# Patient Record
Sex: Male | Born: 1950
Health system: Southern US, Community
[De-identification: ages and names within clinical notes are randomized; demographics above are authoritative.]

## PROBLEM LIST (undated history)

## (undated) DIAGNOSIS — I2510 Atherosclerotic heart disease of native coronary artery without angina pectoris: Secondary | ICD-10-CM

## (undated) DIAGNOSIS — E119 Type 2 diabetes mellitus without complications: Secondary | ICD-10-CM

## (undated) DIAGNOSIS — I10 Essential (primary) hypertension: Secondary | ICD-10-CM

## (undated) DIAGNOSIS — R251 Tremor, unspecified: Secondary | ICD-10-CM

## (undated) DIAGNOSIS — N289 Disorder of kidney and ureter, unspecified: Secondary | ICD-10-CM

## (undated) HISTORY — DX: Tremor, unspecified: R25.1

---

## 2005-09-02 ENCOUNTER — Ambulatory Visit (HOSPITAL_COMMUNITY): Admission: RE | Admit: 2005-09-02 | Discharge: 2005-09-02 | Payer: Self-pay | Admitting: Specialist

## 2005-09-30 ENCOUNTER — Encounter: Admission: RE | Admit: 2005-09-30 | Discharge: 2005-10-27 | Payer: Self-pay | Admitting: Specialist

## 2011-09-17 ENCOUNTER — Other Ambulatory Visit: Payer: Self-pay | Admitting: Neurosurgery

## 2011-09-17 DIAGNOSIS — M5126 Other intervertebral disc displacement, lumbar region: Secondary | ICD-10-CM

## 2011-10-10 ENCOUNTER — Other Ambulatory Visit: Payer: Self-pay

## 2011-10-31 ENCOUNTER — Inpatient Hospital Stay: Admission: RE | Admit: 2011-10-31 | Payer: Self-pay | Source: Ambulatory Visit

## 2012-04-26 ENCOUNTER — Other Ambulatory Visit: Payer: Self-pay | Admitting: Neurosurgery

## 2012-04-26 DIAGNOSIS — M5126 Other intervertebral disc displacement, lumbar region: Secondary | ICD-10-CM

## 2012-04-27 ENCOUNTER — Ambulatory Visit
Admission: RE | Admit: 2012-04-27 | Discharge: 2012-04-27 | Disposition: A | Discharge status: Home / Self Care | Payer: No Typology Code available for payment source | Source: Ambulatory Visit | Attending: Neurosurgery | Admitting: Neurosurgery

## 2012-04-27 DIAGNOSIS — M5126 Other intervertebral disc displacement, lumbar region: Secondary | ICD-10-CM

## 2012-04-27 MED ORDER — METHYLPREDNISOLONE ACETATE 40 MG/ML INJ SUSP (RADIOLOG
120.0000 mg | Freq: Once | INTRAMUSCULAR | Status: AC
  Administered 2012-04-27: 120 mg via EPIDURAL

## 2012-04-27 MED ORDER — IOHEXOL 180 MG/ML  SOLN
1.0000 mL | Freq: Once | INTRAMUSCULAR | Status: AC | PRN
  Administered 2012-04-27: 1 mL via EPIDURAL

## 2012-04-27 NOTE — Discharge Instructions (Signed)

## 2015-05-08 ENCOUNTER — Encounter (HOSPITAL_COMMUNITY)
Admission: EM | Disposition: A | Discharge status: Home / Self Care | Payer: Medicare Other | Source: Home / Self Care | Attending: Cardiovascular Disease

## 2015-05-08 ENCOUNTER — Inpatient Hospital Stay (HOSPITAL_COMMUNITY)
Admission: EM | Admit: 2015-05-08 | Discharge: 2015-05-10 | DRG: 247 | Disposition: A | Discharge status: Home / Self Care | Payer: 59 | Attending: Cardiovascular Disease | Admitting: Cardiovascular Disease

## 2015-05-08 ENCOUNTER — Emergency Department (HOSPITAL_COMMUNITY)
Admission: EM | Admit: 2015-05-08 | Discharge: 2015-05-08 | Disposition: A | Discharge status: Home / Self Care | Payer: 59 | Source: Ambulatory Visit | Attending: Cardiovascular Disease | Admitting: Cardiovascular Disease

## 2015-05-08 ENCOUNTER — Encounter (HOSPITAL_COMMUNITY): Payer: Self-pay

## 2015-05-08 DIAGNOSIS — I2109 ST elevation (STEMI) myocardial infarction involving other coronary artery of anterior wall: Secondary | ICD-10-CM | POA: Diagnosis not present

## 2015-05-08 DIAGNOSIS — Z7982 Long term (current) use of aspirin: Secondary | ICD-10-CM | POA: Diagnosis not present

## 2015-05-08 DIAGNOSIS — Z9981 Dependence on supplemental oxygen: Secondary | ICD-10-CM | POA: Diagnosis not present

## 2015-05-08 DIAGNOSIS — I10 Essential (primary) hypertension: Secondary | ICD-10-CM

## 2015-05-08 DIAGNOSIS — I213 ST elevation (STEMI) myocardial infarction of unspecified site: Secondary | ICD-10-CM

## 2015-05-08 DIAGNOSIS — I255 Ischemic cardiomyopathy: Secondary | ICD-10-CM | POA: Diagnosis present

## 2015-05-08 DIAGNOSIS — I237 Postinfarction angina: Secondary | ICD-10-CM | POA: Diagnosis present

## 2015-05-08 DIAGNOSIS — I2102 ST elevation (STEMI) myocardial infarction involving left anterior descending coronary artery: Secondary | ICD-10-CM | POA: Diagnosis not present

## 2015-05-08 DIAGNOSIS — I2582 Chronic total occlusion of coronary artery: Secondary | ICD-10-CM | POA: Diagnosis present

## 2015-05-08 DIAGNOSIS — Z7902 Long term (current) use of antithrombotics/antiplatelets: Secondary | ICD-10-CM | POA: Diagnosis not present

## 2015-05-08 DIAGNOSIS — I2510 Atherosclerotic heart disease of native coronary artery without angina pectoris: Secondary | ICD-10-CM | POA: Diagnosis not present

## 2015-05-08 DIAGNOSIS — E785 Hyperlipidemia, unspecified: Secondary | ICD-10-CM

## 2015-05-08 DIAGNOSIS — Z79899 Other long term (current) drug therapy: Secondary | ICD-10-CM

## 2015-05-08 DIAGNOSIS — E1159 Type 2 diabetes mellitus with other circulatory complications: Secondary | ICD-10-CM | POA: Diagnosis not present

## 2015-05-08 DIAGNOSIS — Z743 Need for continuous supervision: Secondary | ICD-10-CM | POA: Diagnosis not present

## 2015-05-08 DIAGNOSIS — E119 Type 2 diabetes mellitus without complications: Secondary | ICD-10-CM | POA: Diagnosis present

## 2015-05-08 HISTORY — DX: Type 2 diabetes mellitus without complications: E11.9

## 2015-05-08 HISTORY — DX: Atherosclerotic heart disease of native coronary artery without angina pectoris: I25.10

## 2015-05-08 HISTORY — PX: CARDIAC CATHETERIZATION: SHX172

## 2015-05-08 HISTORY — DX: Essential (primary) hypertension: I10

## 2015-05-08 HISTORY — PX: PERCUTANEOUS CORONARY STENT INTERVENTION (PCI-S): SHX5485

## 2015-05-08 LAB — COMPREHENSIVE METABOLIC PANEL
ALK PHOS: 79 U/L (ref 38–126)
ALT: 42 U/L (ref 17–63)
AST: 165 U/L — ABNORMAL HIGH (ref 15–41)
Albumin: 4.4 g/dL (ref 3.5–5.0)
Anion gap: 9 (ref 5–15)
BUN: 16 mg/dL (ref 6–20)
CO2: 28 mmol/L (ref 22–32)
Calcium: 9 mg/dL (ref 8.9–10.3)
Chloride: 100 mmol/L — ABNORMAL LOW (ref 101–111)
Creatinine, Ser: 1.1 mg/dL (ref 0.61–1.24)
GLUCOSE: 183 mg/dL — AB (ref 70–99)
POTASSIUM: 4.3 mmol/L (ref 3.5–5.1)
SODIUM: 137 mmol/L (ref 135–145)
Total Bilirubin: 0.9 mg/dL (ref 0.3–1.2)
Total Protein: 8 g/dL (ref 6.5–8.1)

## 2015-05-08 LAB — I-STAT TROPONIN, ED: TROPONIN I, POC: 10.43 ng/mL — AB (ref 0.00–0.08)

## 2015-05-08 LAB — APTT: aPTT: 27 seconds (ref 24–37)

## 2015-05-08 LAB — I-STAT CHEM 8, ED
BUN: 16 mg/dL (ref 6–20)
CHLORIDE: 99 mmol/L — AB (ref 101–111)
Calcium, Ion: 1.13 mmol/L (ref 1.13–1.30)
Creatinine, Ser: 1.1 mg/dL (ref 0.61–1.24)
Glucose, Bld: 186 mg/dL — ABNORMAL HIGH (ref 70–99)
HCT: 49 % (ref 39.0–52.0)
Hemoglobin: 16.7 g/dL (ref 13.0–17.0)
POTASSIUM: 4.4 mmol/L (ref 3.5–5.1)
SODIUM: 138 mmol/L (ref 135–145)
TCO2: 23 mmol/L (ref 0–100)

## 2015-05-08 LAB — CBC
HEMATOCRIT: 44.8 % (ref 39.0–52.0)
HEMOGLOBIN: 15.2 g/dL (ref 13.0–17.0)
MCH: 30.8 pg (ref 26.0–34.0)
MCHC: 33.9 g/dL (ref 30.0–36.0)
MCV: 90.9 fL (ref 78.0–100.0)
Platelets: 302 10*3/uL (ref 150–400)
RBC: 4.93 MIL/uL (ref 4.22–5.81)
RDW: 12.3 % (ref 11.5–15.5)
WBC: 15.4 10*3/uL — ABNORMAL HIGH (ref 4.0–10.5)

## 2015-05-08 LAB — PROTIME-INR
INR: 0.98 (ref 0.00–1.49)
PROTHROMBIN TIME: 13.1 s (ref 11.6–15.2)

## 2015-05-08 LAB — MRSA PCR SCREENING: MRSA BY PCR: NEGATIVE

## 2015-05-08 LAB — POCT ACTIVATED CLOTTING TIME: Activated Clotting Time: 626 seconds

## 2015-05-08 LAB — TROPONIN I: Troponin I: >65 ng/mL (ref <0.031)

## 2015-05-08 SURGERY — LEFT HEART CATH AND CORONARY ANGIOGRAPHY

## 2015-05-08 MED ORDER — HEPARIN SODIUM (PORCINE) 1000 UNIT/ML IJ SOLN
INTRAMUSCULAR | Status: DC | PRN
  Administered 2015-05-08: 2000 [IU] via INTRAVENOUS

## 2015-05-08 MED ORDER — VERAPAMIL HCL 2.5 MG/ML IV SOLN
INTRAVENOUS | Status: DC | PRN
  Administered 2015-05-08: 12:00:00 via INTRA_ARTERIAL

## 2015-05-08 MED ORDER — HEPARIN (PORCINE) IN NACL 100-0.45 UNIT/ML-% IJ SOLN
INTRAMUSCULAR | Status: AC
  Filled 2015-05-08: qty 250

## 2015-05-08 MED ORDER — SODIUM CHLORIDE 0.9 % IV SOLN
INTRAVENOUS | Status: DC
  Administered 2015-05-08: 11:00:00 via INTRAVENOUS
  Administered 2015-05-08: 250 mL via INTRAVENOUS

## 2015-05-08 MED ORDER — ATORVASTATIN CALCIUM 80 MG PO TABS
80.0000 mg | ORAL_TABLET | Freq: Every day | ORAL | Status: DC
  Administered 2015-05-08 – 2015-05-09 (×2): 80 mg via ORAL
  Filled 2015-05-08 (×3): qty 1

## 2015-05-08 MED ORDER — MIDAZOLAM HCL 2 MG/2ML IJ SOLN
INTRAMUSCULAR | Status: DC | PRN
  Administered 2015-05-08: 2 mg via INTRAVENOUS

## 2015-05-08 MED ORDER — NITROGLYCERIN 1 MG/10 ML FOR IR/CATH LAB
INTRA_ARTERIAL | Status: DC | PRN
  Administered 2015-05-08 (×3): 100 ug via INTRA_ARTERIAL

## 2015-05-08 MED ORDER — ASPIRIN 81 MG PO CHEW
324.0000 mg | CHEWABLE_TABLET | Freq: Once | ORAL | Status: AC
  Administered 2015-05-08: 324 mg via ORAL

## 2015-05-08 MED ORDER — ONDANSETRON HCL 4 MG/2ML IJ SOLN: 4.0000 mg | Freq: Four times a day (QID) | INTRAMUSCULAR | Status: DC | PRN

## 2015-05-08 MED ORDER — HEPARIN BOLUS VIA INFUSION
4000.0000 [IU] | Freq: Once | INTRAVENOUS | Status: AC
  Administered 2015-05-08: 4000 [IU] via INTRAVENOUS

## 2015-05-08 MED ORDER — IOHEXOL 350 MG/ML SOLN
INTRAVENOUS | Status: DC | PRN
  Administered 2015-05-08: 200 mL via INTRACARDIAC

## 2015-05-08 MED ORDER — TICAGRELOR 90 MG PO TABS
ORAL_TABLET | ORAL | Status: DC | PRN
Start: 2015-05-08 — End: 2015-05-08
  Administered 2015-05-08: 180 mg via ORAL

## 2015-05-08 MED ORDER — BIVALIRUDIN BOLUS VIA INFUSION - CUPID
INTRAVENOUS | Status: DC | PRN
  Administered 2015-05-08: 68.025 mg via INTRAVENOUS

## 2015-05-08 MED ORDER — HEPARIN SODIUM (PORCINE) 5000 UNIT/ML IJ SOLN: 60.0000 [IU]/kg | INTRAMUSCULAR | Status: DC

## 2015-05-08 MED ORDER — HEPARIN (PORCINE) IN NACL 100-0.45 UNIT/ML-% IJ SOLN
1100.0000 [IU]/h | INTRAMUSCULAR | Status: DC
  Administered 2015-05-08: 1100 [IU]/h via INTRAVENOUS
  Filled 2015-05-08: qty 250

## 2015-05-08 MED ORDER — ASPIRIN 81 MG PO CHEW
CHEWABLE_TABLET | ORAL | Status: AC
  Filled 2015-05-08: qty 4

## 2015-05-08 MED ORDER — SODIUM CHLORIDE 0.9 % IJ SOLN
3.0000 mL | INTRAMUSCULAR | Status: DC | PRN
Start: 2015-05-08 — End: 2015-05-10

## 2015-05-08 MED ORDER — TICAGRELOR 90 MG PO TABS
90.0000 mg | ORAL_TABLET | Freq: Two times a day (BID) | ORAL | Status: DC
  Administered 2015-05-08 – 2015-05-10 (×4): 90 mg via ORAL
  Filled 2015-05-08 (×5): qty 1

## 2015-05-08 MED ORDER — SODIUM CHLORIDE 0.9 % IV SOLN: 250.0000 mL | INTRAVENOUS | Status: DC | PRN

## 2015-05-08 MED ORDER — SODIUM CHLORIDE 0.9 % IJ SOLN
3.0000 mL | Freq: Two times a day (BID) | INTRAMUSCULAR | Status: DC
  Administered 2015-05-09 – 2015-05-10 (×3): 3 mL via INTRAVENOUS

## 2015-05-08 MED ORDER — ACETAMINOPHEN 325 MG PO TABS: 650.0000 mg | ORAL_TABLET | ORAL | Status: DC | PRN

## 2015-05-08 MED ORDER — ASPIRIN EC 81 MG PO TBEC
81.0000 mg | DELAYED_RELEASE_TABLET | Freq: Every day | ORAL | Status: DC
Start: 2015-05-09 — End: 2015-05-10
  Administered 2015-05-09 – 2015-05-10 (×2): 81 mg via ORAL
  Filled 2015-05-08 (×2): qty 1

## 2015-05-08 MED ORDER — SODIUM CHLORIDE 0.9 % IV SOLN
250.0000 mg | INTRAVENOUS | Status: DC | PRN
  Administered 2015-05-08: 1.75 mg/kg/h via INTRAVENOUS

## 2015-05-08 MED ORDER — SODIUM CHLORIDE 0.9 % IV SOLN
INTRAVENOUS | Status: AC
Start: 2015-05-08 — End: 2015-05-08

## 2015-05-08 MED ORDER — FENTANYL CITRATE (PF) 100 MCG/2ML IJ SOLN
INTRAMUSCULAR | Status: DC | PRN
  Administered 2015-05-08: 50 ug via INTRAVENOUS

## 2015-05-08 SURGICAL SUPPLY — 21 items
BALLN EUPHORA RX 2.5X12 (BALLOONS) ×3
BALLN ~~LOC~~ EUPHORA RX 3.75X15 (BALLOONS) ×3
BALLOON EUPHORA RX 2.5X12 (BALLOONS) ×2 IMPLANT
BALLOON ~~LOC~~ EUPHORA RX 3.75X15 (BALLOONS) ×2 IMPLANT
CATH INFINITI 5FR MULTPACK ANG (CATHETERS) IMPLANT
CATH OPTITORQUE TIG 4.0 5F (CATHETERS) ×3 IMPLANT
CATH VISTA GUIDE 6FR XBLAD3.5 (CATHETERS) ×3 IMPLANT
DEVICE RAD COMP TR BAND LRG (VASCULAR PRODUCTS) ×3 IMPLANT
ELECT DEFIB PAD ADLT CADENCE (PAD) ×3 IMPLANT
GLIDESHEATH SLEND A-KIT 6F 22G (SHEATH) ×3 IMPLANT
KIT ENCORE 26 ADVANTAGE (KITS) ×3 IMPLANT
KIT HEART LEFT (KITS) ×3 IMPLANT
PACK CARDIAC CATHETERIZATION (CUSTOM PROCEDURE TRAY) ×3 IMPLANT
SHEATH PINNACLE 5F 10CM (SHEATH) ×3 IMPLANT
STENT XIENCE ALPINE RX 3.5X23 (Permanent Stent) ×3 IMPLANT
SYR MEDRAD MARK V 150ML (SYRINGE) ×3 IMPLANT
TRANSDUCER W/STOPCOCK (MISCELLANEOUS) ×3 IMPLANT
TUBING CIL FLEX 10 FLL-RA (TUBING) ×3 IMPLANT
WIRE ASAHI MEDIUM 180CM (WIRE) ×3 IMPLANT
WIRE EMERALD 3MM-J .035X150CM (WIRE) IMPLANT
WIRE SAFE-T 1.5MM-J .035X260CM (WIRE) ×3 IMPLANT

## 2015-05-08 NOTE — ED Notes (Signed)
RN explained transfer need, patient signed transfer consent form.

## 2015-05-08 NOTE — ED Notes (Signed)
Called [facility] to transport pt[facility].

## 2015-05-08 NOTE — ED Notes (Signed)
TROPONIN : ISTAT: 10.43  MD MILLER MADE AWARE.

## 2015-05-08 NOTE — ED Notes (Signed)
[facility] at bedside.   Family made aware of pt status.

## 2015-05-08 NOTE — ED Notes (Signed)
C/o mid center cp x3 days. Describes pain as a burning sensation.

## 2015-05-08 NOTE — Progress Notes (Signed)
ANTICOAGULATION CONSULT NOTE - Initial Consult  Pharmacy Consult for Heparin Indication: chest pain/ACS  No Known Allergies  Patient Measurements: Height: 6\' 1"  (185.4 cm) Weight: 200 lb (90.719 kg) IBW/kg (Calculated) : 79.9 HEPARIN DW (KG): 90.7  Vital Signs: Temp: 98.5 F (36.9 C) (05/10 1030) Temp Source: Oral (05/10 1030) BP: 123/92 mmHg (05/10 1030) Pulse Rate: 112 (05/10 1030)  Labs: No results for input(s): HGB, HCT, PLT, APTT, LABPROT, INR, HEPARINUNFRC, CREATININE, CKTOTAL, CKMB, TROPONINI in the last 72 hours.  CrCl cannot be calculated (Patient has no serum creatinine result on file.).  Medical History: Past Medical History  Diagnosis Date  . Diabetes mellitus without complication   . Hypertension    Assessment: 64yo male with c/o chest pain and burning.  ED called and asked pharmacy to order Heparin per protocol as STAT order.    Goal of Therapy:  Heparin level 0.3-0.7 units/ml w/in 24hrs of initiation Monitor platelets by anticoagulation protocol: Yes   Plan:   Heparin 4000 units/hr bolus IV now  Heparin infusion at 1100 units/hr  Heparin level in 6 hrs then daily  CBC daily while on Heparin  Margo AyeHall, Gagan Dillion A 05/08/2015,11:03 AM

## 2015-05-08 NOTE — Plan of Care (Signed)
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

## 2015-05-08 NOTE — ED Provider Notes (Signed)
CSN: [id number]     Arrival date & time 05/08/15  1025 History   First MD Initiated Contact with Patient 05/08/15 1042     Chief Complaint  Patient presents with  . Chest Pain     (Consider location/radiation/quality/duration/timing/severity/associated sxs/prior Treatment) HPI Comments: 64 y/o male, 3 days of discomfort in chest - burning / pressure, no radiation - has some weakness, but no nausea, sob or swelling, no rf for PE - has DM and Htn - went to MD's office this morning and recommended he come here.  Was much worse last night - eased off at this time- took 325 ASA last night - no prior stress or cath, no known heart disease, no cholesterdal / tobacco use - unsure if anything makes better or worse - though was GERD.  Patient is a 64 y.o. male presenting with chest pain. The history is provided by the patient.  Chest Pain   Past Medical History  Diagnosis Date  . Diabetes mellitus without complication   . Hypertension    History reviewed. No pertinent past surgical history. No family history on file. History  Substance Use Topics  . Smoking status: Never Smoker   . Smokeless tobacco: Not on file  . Alcohol Use: No    Review of Systems  Cardiovascular: Positive for chest pain.  All other systems reviewed and are negative.     Allergies  Review of patient's allergies indicates no known allergies.  Home Medications   Prior to Admission medications   Not on File   BP 123/92 mmHg  Pulse 112  Temp(Src) 98.5 F (36.9 C) (Oral)  Resp 16  SpO2 100% Physical Exam  Constitutional: He appears well-developed and well-nourished. No distress.  HENT:  Head: Normocephalic and atraumatic.  Mouth/Throat: Oropharynx is clear and moist. No oropharyngeal exudate.  Eyes: Conjunctivae and EOM are normal. Pupils are equal, round, and reactive to light. Right eye exhibits no discharge. Left eye exhibits no discharge. No scleral icterus.  Neck: Normal range of motion. Neck  supple. No JVD present. No thyromegaly present.  Cardiovascular: Regular rhythm, normal heart sounds and intact distal pulses.  Exam reveals no gallop and no friction rub.   No murmur heard. Mild tachy  Pulmonary/Chest: Effort normal and breath sounds normal. No respiratory distress. He has no wheezes. He has no rales.  Abdominal: Soft. Bowel sounds are normal. He exhibits no distension and no mass. There is no tenderness.  Musculoskeletal: Normal range of motion. He exhibits no edema or tenderness.  Lymphadenopathy:    He has no cervical adenopathy.  Neurological: He is alert. Coordination normal.  Skin: Skin is warm and dry. No rash noted. No erythema.  Psychiatric: He has a normal mood and affect. His behavior is normal.  Nursing note and vitals reviewed.   ED Course  Procedures (including critical care time) Labs Review Labs Reviewed  APTT  CBC  COMPREHENSIVE METABOLIC PANEL  PROTIME-INR  I-STAT TROPOININ, ED  I-STAT CHEM 8, ED    Imaging Review No results found.   EKG Interpretation   Date/Time:  Tuesday May 08 2015 10:41:10 EDT Ventricular Rate:  104 PR Interval:  144 QRS Duration: 90 QT Interval:  361 QTC Calculation: 475 R Axis:   -161 Text Interpretation:  Sinus tachycardia Low voltage, precordial leads  Right ventricular hypertrophy Nonspecific T abnrm, anterolateral leads ST  elevation, consider lateral injury ** ** ACUTE MI / STEMI ** ** No old  tracing to compare Confirmed by [facility]  MD, Arlys JohnBRIAN ([id number]) on 05/08/2015  10:48:40 AM      MDM   Final diagnoses:  ST elevation myocardial infarction (STEMI), unspecified artery    ECG very abnormal - suspect ongoing MI - some elements of STEMI prsent - activated. ASA' Nitro Heparin Stat consult to cardiology.  Accepted by Dr. Tresa EndoKelly.  10:55 AM  CRITICAL CARE Performed by: Vida RollerMILLER,Kasim Mccorkle D Total critical care time: 35  Critical care time was exclusive of separately billable procedures and treating other  patients. Critical care was necessary to treat or prevent imminent or life-threatening deterioration. Critical care was time spent personally by me on the following activities: development of treatment plan with patient and/or surrogate as well as nursing, discussions with consultants, evaluation of patient's response to treatment, examination of patient, obtaining history from patient or surrogate, ordering and performing treatments and interventions, ordering and review of laboratory studies, ordering and review of radiographic studies, pulse oximetry and re-evaluation of patient's condition.  Meds given in ED:  Medications  0.9 %  sodium chloride infusion (not administered)  heparin injection 60 Units/kg (not administered)  aspirin 81 MG chewable tablet (not administered)  aspirin chewable tablet 324 mg (324 mg Oral Given 05/08/15 1053)    New Prescriptions   No medications on file        Eber HongBrian Fatimata Talsma, MD 05/08/15 1055

## 2015-05-08 NOTE — H&P (Signed)
Cardiology Admission Note      Date: 05/08/2015               Patient Name:  Harry Bowen MRN: [id number]  DOB: 13-Sep-1951 Age / Sex: 64 y.o., male   PCP: Harry PandyPaul W Sasser, MD         Chief Complaint: chest pain  History of Present Illness:  [name] is a 64 year old male with PMH of HTN and DM type 2 who reports CP x 4 days duration.  It is constant 3-4/10 CP that came on gradually.  He describes it as "tightness and burning" located in the center of his chest.  The pain does not radiate to his neck, arm or back.  He denies dyspnea, N/V, diaphoresis or lightheadedness.  He attributed it to indigestion.  The pain worsened last night and he decided to see his PCP this morning.  His PCP directed him to [facility] where he was found to have a ST elevation on EKG and troponin 10.43.  He was given ASA, NTG, started on heparin gtt and [facility].  [facility] he is awake, alert, communicative and stable.  CP improving with above intervention.    He reports personal history of HTN and DM well controlled with oral medications.  He denies personal CAD hx, HLD, EtOH or illicit drug use.  He has never been a smoker.  He had a grandfather who had an MI after age 66 and reports no other family hx of heart disease.    Meds: Current Facility-Administered Medications  Medication Dose Route Frequency Provider Last Rate Last Dose  . 0.9 %  sodium chloride infusion   Intravenous Continuous Harry HongBrian Miller, MD 20 mL/hr at 05/08/15 1058    . fentaNYL (SUBLIMAZE) injection    PRN Harry Biharihomas A Linas Stepter, MD   50 mcg at 05/08/15 1211  . heparin ADULT infusion 100 units/mL (25000 units/250 mL)  1,100 Units/hr Intravenous Continuous Harry HongBrian Miller, MD 11 mL/hr at 05/08/15 1116 1,100 Units/hr at 05/08/15 1116  . midazolam (VERSED) injection    PRN Harry Biharihomas A Vira Chaplin, MD   2 mg at 05/08/15 1211    Allergies: Allergies as of 05/08/2015  . (No Known Allergies)   Past Medical History  Diagnosis Date  . Diabetes mellitus  without complication   . Hypertension    History reviewed. No pertinent past surgical history. No family history on file. History   Social History  . Marital Status: Married    Spouse Name: N/A  . Number of Children: N/A  . Years of Education: N/A   Occupational History  . Not on file.   Social History Main Topics  . Smoking status: Never Smoker   . Smokeless tobacco: Not on file  . Alcohol Use: No  . Drug Use: Not on file  . Sexual Activity: Not on file   Other Topics Concern  . Not on file   Social History Narrative  . No narrative on file    Review of Systems: General:  He denies fatigue, anorexia or unexplained weight loss Cardiopulmonary:  See HPI; he denies dyspnea, PND, orthopnea or LE edema GI:  He denies abdominal pain, N/V or dark stools GU:  Denies dysuria or difficulty urinating Neuro:  Denies weakness  Physical Exam: Blood pressure 123/92, pulse 112, temperature 98.5 F (36.9 C), temperature source Oral, resp. rate 16, height 6\' 1"  (1.854 m), weight 200 lb (90.719 kg), SpO2 100 %.  General:  Laying on gurney in NAD, pleasant and cooperative HEENT:  [place]/AT, no JVD or carotid bruits, [place] in place Cardiac:  Tachycardic, regular rhythm, no rubs/murmus/gallops Pulm:  Clear to auscultation B/L, no wheezes/rales/rhonchi Abd:  +BS, soft, nontender, nondistended Extremities:  Warm, well perfused, no LE edema Neuro:  AAO x3, responding appropriately, moving extremities on command   Lab results: Basic Metabolic Panel:  Recent Labs  21/30/8604/10/13 1050  NA 137  K 4.3  CL 100*  CO2 28  GLUCOSE 183*  BUN 16  CREATININE 1.10  CALCIUM 9.0   Liver Function Tests:  Recent Labs  05/08/15 1050  AST 165*  ALT 42  ALKPHOS 79  BILITOT 0.9  PROT 8.0  ALBUMIN 4.4   CBC:  Recent Labs  05/08/15 1050  WBC 15.4*  HGB 15.2  HCT 44.8  MCV 90.9  PLT 302   Cardiac Enzymes: poc Troponin:  10.43   Coagulation:  Recent Labs  05/08/15 1050  LABPROT 13.1   INR 0.98    Other results: EKG:  Sinus tachycardia (HR 104); extreme R axis deviation; ST elevation avL, V1-V3; Q waves I, avL, V1-V3; ST depression III, avF  Assessment & Plan by Problem: 64 year old male with PMH of HTN and DM type 2 with 4 day history of CP found to have STEMI.  ST elevation MI:  CP, troponin 10.43 and ST elevation.  ST elevations, Q waves in V1-V3 and reciprocal changes in inferior leads suggestive of anterior/apical infarction.  TIMI risk score for STEMI is 7 in this patient (23% risk of 30 day all cause mortality).   - to cath lab for urgent PCI with stenting if amenable lesion - admit to CCU on cardiology service - supplemental oxygen prn - monitor closely for MI complications (ie arrhythmia, HF) - repeat EKG, trend troponin - 2D ECHO - continue heparin gtt - continue ASA 81mg  daily; will need DAPT x 12 months if stent placed - start Lipitor 80mg  daily, carvedilol 3.125mg  BID, lisinopril 5mg  daily (titrate to 10mg  in the next 48 hours) - risk stratification - AM fasting lipid panel, hgb A1c - check CBC and renal function in the morning post-cath  Essential hypertension:  The patient could not remember his home medications but he reports compliance.  - request pharmacy reconciliation of home medications - monitor BP  Diabetes mellitus:  The patient says his DM is well controlled on metformin.   - check hgb A1c  - CBGs ac/hs - SSI  VTE ppx:  SCDs and currently on heparin gtt  Signed: Yolanda MangesAlex M Wilson, DO IMTS, PGY2 05/08/2015, 12:16 PM   Patient seen and examined. Agree with assessment and plan. Pt is a 64 yo WM without known prior CAD. He has a h/o HTN and DM. He has experienced recurrent chest pain over the past 3-4 days which became more intense last night. He had initially contributed this to indigestion. Due to recurrent discomfort today he presented to his primary MD who [facility]. He presents in transfer from [facility] with probable out  of hospital anterior MI of greater than 12 hrs with post infarct angina. ECG shows Q waves anteroseptally with ST elevation. Pt is still experiencing 3/10 residual discomfort. Plan emergent cath/PCI if needed.Suspect LAD occlusion.   Harry Biharihomas A. Leverett Camplin, MD, [facility] 05/08/2015 10:14 PM

## 2015-05-09 DIAGNOSIS — I2109 ST elevation (STEMI) myocardial infarction involving other coronary artery of anterior wall: Secondary | ICD-10-CM

## 2015-05-09 DIAGNOSIS — E1159 Type 2 diabetes mellitus with other circulatory complications: Secondary | ICD-10-CM

## 2015-05-09 LAB — CBC
HCT: 39.3 % (ref 39.0–52.0)
Hemoglobin: 13.4 g/dL (ref 13.0–17.0)
MCH: 30.5 pg (ref 26.0–34.0)
MCHC: 34.1 g/dL (ref 30.0–36.0)
MCV: 89.5 fL (ref 78.0–100.0)
PLATELETS: 253 10*3/uL (ref 150–400)
RBC: 4.39 MIL/uL (ref 4.22–5.81)
RDW: 12.4 % (ref 11.5–15.5)
WBC: 12.6 10*3/uL — AB (ref 4.0–10.5)

## 2015-05-09 LAB — LIPID PANEL
CHOL/HDL RATIO: 4 ratio
CHOLESTEROL: 144 mg/dL (ref 0–200)
HDL: 36 mg/dL — ABNORMAL LOW (ref >40)
LDL Cholesterol: 91 mg/dL (ref 0–99)
Triglycerides: 87 mg/dL (ref <150)
VLDL: 17 mg/dL (ref 0–40)

## 2015-05-09 LAB — HEMOGLOBIN A1C
HEMOGLOBIN A1C: 7.3 % — AB (ref 4.8–5.6)
MEAN PLASMA GLUCOSE: 163 mg/dL

## 2015-05-09 LAB — GLUCOSE, CAPILLARY
GLUCOSE-CAPILLARY: 246 mg/dL — AB (ref 70–99)
GLUCOSE-CAPILLARY: 125 mg/dL — AB (ref 70–99)
GLUCOSE-CAPILLARY: 224 mg/dL — AB (ref 70–99)

## 2015-05-09 LAB — BASIC METABOLIC PANEL
ANION GAP: 8 (ref 5–15)
BUN: 12 mg/dL (ref 6–20)
CHLORIDE: 102 mmol/L (ref 101–111)
CO2: 24 mmol/L (ref 22–32)
Calcium: 8.3 mg/dL — ABNORMAL LOW (ref 8.9–10.3)
Creatinine, Ser: 1.12 mg/dL (ref 0.61–1.24)
GFR calc non Af Amer: >60 mL/min (ref >60)
Glucose, Bld: 167 mg/dL — ABNORMAL HIGH (ref 70–99)
POTASSIUM: 4.1 mmol/L (ref 3.5–5.1)
SODIUM: 134 mmol/L — AB (ref 135–145)

## 2015-05-09 MED ORDER — INSULIN ASPART 100 UNIT/ML ~~LOC~~ SOLN
0.0000 [IU] | Freq: Three times a day (TID) | SUBCUTANEOUS | Status: DC
  Administered 2015-05-09: 1 [IU] via SUBCUTANEOUS
  Administered 2015-05-09: 3 [IU] via SUBCUTANEOUS
  Administered 2015-05-10: 2 [IU] via SUBCUTANEOUS

## 2015-05-09 MED ORDER — METOPROLOL TARTRATE 12.5 MG HALF TABLET
12.5000 mg | ORAL_TABLET | Freq: Two times a day (BID) | ORAL | Status: DC
Start: 2015-05-09 — End: 2015-05-10
  Administered 2015-05-09 – 2015-05-10 (×3): 12.5 mg via ORAL
  Filled 2015-05-09 (×4): qty 1

## 2015-05-09 MED FILL — Heparin Sodium (Porcine) 2 Unit/ML in Sodium Chloride 0.9%: INTRAMUSCULAR | Qty: 1500 | Status: AC

## 2015-05-09 MED FILL — Lidocaine HCl Local Preservative Free (PF) Inj 1%: INTRAMUSCULAR | Qty: 30 | Status: AC

## 2015-05-09 NOTE — Progress Notes (Signed)
CARDIAC REHAB PHASE I   PRE:  Rate/Rhythm: 92 SR  BP:  Sitting: 101/67        SaO2: 99 RA  MODE:  Ambulation: 700 ft   POST:  Rate/Rhythm: 106 ST  BP:  Sitting: 117/77         SaO2: 98 RA  Pt ambulated 700 ft on RA, independent, steady gait, tolerated well.  Pt denies cp, DOE, dizziness, declined rest stop. Began stent education. Reviewed anti-platelet therapy, stent card, gave diet handouts and MI book. Pt verbalized understanding. Pt transferring to telemetry today, will follow up tomorrow.    [id number]  Harry Bowen, Harry Passon C, RN, BSN 05/09/2015 10:50 AM

## 2015-05-09 NOTE — Progress Notes (Signed)
Cardiology Progress Note   Subjective: [name] was seen and examined this morning.  He is feeling well and denies CP, dyspnea or lightheadedness.    Objective: Vital signs in last 24 hours: Filed Vitals:   05/09/15 0400 05/09/15 0500 05/09/15 0600 05/09/15 0730  BP: 96/67  81/47 101/62  Pulse: 87 83 85 93  Temp: 99.3 F (37.4 C)   99.5 F (37.5 C)  TempSrc: Oral   Oral  Resp: 22 23 21 24   Height:      Weight:      SpO2: 98% 98% 97% 98%   Weight change:   Intake/Output Summary (Last 24 hours) at 05/09/15 [id number] Last data filed at 05/09/15 0600  Gross per 24 hour  Intake 1709.74 ml  Output   2275 ml  Net -565.26 ml   Physical Exam: General: in bed in NAD, pleasant and cooperative HEENT: [place]/AT, no JVD or carotid bruits Cardiac: RRR, no rubs/murmus/gallops, distal pulses intact Pulm: Clear to auscultation B/L, no wheezes/rales/rhonchi Abd: +BS, soft, nontender, nondistended Extremities: Warm and well perfused, no LE edema, right radial dressing c/d/i Neuro: AAO x3, responding appropriately, moving extremities spontaneously, able to sit up unassisted  Telemetry:  NSR 88  Lab Results: Basic Metabolic Panel:  Recent Labs Lab 05/08/15 1050 05/08/15 1057 05/09/15 0645  NA 137 138 134*  K 4.3 4.4 4.1  CL 100* 99* 102  CO2 28  --  24  GLUCOSE 183* 186* 167*  BUN 16 16 12   CREATININE 1.10 1.10 1.12  CALCIUM 9.0  --  8.3*   Liver Function Tests:  Recent Labs Lab 05/08/15 1050  AST 165*  ALT 42  ALKPHOS 79  BILITOT 0.9  PROT 8.0  ALBUMIN 4.4   CBC:  Recent Labs Lab 05/08/15 1050 05/08/15 1057 05/09/15 0645  WBC 15.4*  --  12.6*  HGB 15.2 16.7 13.4  HCT 44.8 49.0 39.3  MCV 90.9  --  89.5  PLT 302  --  253   Cardiac Enzymes:  Recent Labs Lab 05/08/15 1421  TROPONINI >65.00*   Hemoglobin A1C:  Recent Labs Lab 05/08/15 1519  HGBA1C 7.3*   Fasting Lipid Panel:  Recent Labs Lab 05/09/15 0645  CHOL 144  HDL 36*  LDLCALC 91   TRIG 87  CHOLHDL 4.0   Coagulation:  Recent Labs Lab 05/08/15 1050  LABPROT 13.1  INR 0.98   Medications: I have reviewed the patient's current medications. Scheduled Meds: . aspirin EC  81 mg Oral Daily  . atorvastatin  80 mg Oral q1800  . sodium chloride  3 mL Intravenous Q12H  . ticagrelor  90 mg Oral BID   Continuous Infusions:  PRN Meds:.sodium chloride, acetaminophen, ondansetron (ZOFRAN) IV, sodium chloride   Assessment/Plan: 64 year old male with PMH of HTN and DM type 2 with 4 day history of CP found to have STEMI.  STEMI:  ST elevation and Q waves in V1-V3 c/w anteroseptal MI.  Emergent cath revealed complete occlusion of the LAD and the patient is s/p DES to the LAD.  The patient denies CP currently and says he has not experienced CP since prior to cath.  There are no new murmurs, rubs or gallops on cardiac exam. - transfer to telemetry - continue to monitor for MI complications for at least another 24 hours inpatient - will defer 2D ECHO to outpatient since EF was estimated at 45% during cath LV-gram - continue ASA and Brilinta x 12 months post DES - continue Lipitor  80mg  daily, add back home lisinopril once BP can tolerate - add metoprolol 12.5mg  BID  Essential hypertension:  BPs soft this AM.  Patient asymptomatic.  He is on lisinopril 10mg  daily at home. - monitor BP - add metoprolol as above - add back lisinopril once BP can tolerate  Diabetes mellitus:  Hgb A1c is 7.3.  He is on metformin 1000mg  daily at home. - hold metformin for now since he has just received contrast with PCI - monitor CBGs ac/hs - SSI-sensitive    LOS: 1 day   Yolanda MangesAlex M Wilson, DO 05/09/2015, 7:22 AM  I have examined the patient and reviewed assessment and plan and discussed with patient.  Agree with above as stated.   Doing well post LAD PCI.  Intensify medical therapy with beta blocker.  Possible d/c tomorrow.  F/u with Dr. [name].    Harry Regnier S.

## 2015-05-09 NOTE — Progress Notes (Signed)
CSW (Clinical Child psychotherapistocial Worker) received consult. Consult may be more appropriate for [facility] for [facility]. CSW will sign on if needs arise.    Adon Gehlhausen, LCSWA [phone]

## 2015-05-09 NOTE — Progress Notes (Signed)
EKG CRITICAL VALUE     12 lead EKG performed.  Critical value noted.  [place], RN notified.   Ihor GullyBrown, Lizzete Gough M, CCT 05/09/2015 6:56 AM

## 2015-05-10 ENCOUNTER — Encounter (HOSPITAL_COMMUNITY): Payer: Self-pay | Admitting: Nurse Practitioner

## 2015-05-10 ENCOUNTER — Telehealth: Payer: Self-pay | Admitting: Cardiovascular Disease

## 2015-05-10 DIAGNOSIS — E785 Hyperlipidemia, unspecified: Secondary | ICD-10-CM

## 2015-05-10 DIAGNOSIS — I2510 Atherosclerotic heart disease of native coronary artery without angina pectoris: Secondary | ICD-10-CM | POA: Diagnosis present

## 2015-05-10 LAB — COMPREHENSIVE METABOLIC PANEL
ALT: 25 U/L (ref 17–63)
AST: 50 U/L — AB (ref 15–41)
Albumin: 3.1 g/dL — ABNORMAL LOW (ref 3.5–5.0)
Alkaline Phosphatase: 59 U/L (ref 38–126)
Anion gap: 10 (ref 5–15)
BILIRUBIN TOTAL: 0.9 mg/dL (ref 0.3–1.2)
BUN: 16 mg/dL (ref 6–20)
CHLORIDE: 101 mmol/L (ref 101–111)
CO2: 23 mmol/L (ref 22–32)
Calcium: 8.3 mg/dL — ABNORMAL LOW (ref 8.9–10.3)
Creatinine, Ser: 1.19 mg/dL (ref 0.61–1.24)
GFR calc Af Amer: >60 mL/min (ref >60)
Glucose, Bld: 163 mg/dL — ABNORMAL HIGH (ref 65–99)
POTASSIUM: 4 mmol/L (ref 3.5–5.1)
Sodium: 134 mmol/L — ABNORMAL LOW (ref 135–145)
Total Protein: 6.8 g/dL (ref 6.5–8.1)

## 2015-05-10 LAB — GLUCOSE, CAPILLARY
GLUCOSE-CAPILLARY: 189 mg/dL — AB (ref 65–99)
Glucose-Capillary: 153 mg/dL — ABNORMAL HIGH (ref 65–99)

## 2015-05-10 MED ORDER — METOPROLOL SUCCINATE ER 25 MG PO TB24: 25.0000 mg | ORAL_TABLET | Freq: Every day | ORAL | Status: DC

## 2015-05-10 MED ORDER — TICAGRELOR 90 MG PO TABS: 90.0000 mg | ORAL_TABLET | Freq: Two times a day (BID) | ORAL | Status: DC

## 2015-05-10 MED ORDER — NITROGLYCERIN 0.4 MG SL SUBL: 0.4000 mg | SUBLINGUAL_TABLET | SUBLINGUAL | Status: DC | PRN

## 2015-05-10 MED ORDER — ASPIRIN 81 MG PO TBEC: 81.0000 mg | DELAYED_RELEASE_TABLET | Freq: Every day | ORAL | Status: DC

## 2015-05-10 MED ORDER — ATORVASTATIN CALCIUM 80 MG PO TABS: 80.0000 mg | ORAL_TABLET | Freq: Every day | ORAL | Status: DC

## 2015-05-10 NOTE — Progress Notes (Signed)
CARDIAC REHAB PHASE I   PRE:  Rate/Rhythm: 94 SR  BP:  Sitting: 113/74        SaO2: 100 RA  MODE:  Ambulation: 960 ft   POST:  Rate/Rhythm: 94 SR  BP:  Sitting: 139/88         SaO2: 100 RA  Pt ambulated 960 ft on RA, independent, steady gait, tolerated well.  Pt denies cp, dizziness, DOE, declined rest stop. Completed MI/stent/heart failure education. Reviewed anti-platelet therapy, stent card, activity restrictions, ntg, exercise, heart healthy diet, carb counting, fluid and diet restriction, daily weights, phase 2 cardiac rehab. Pt verbalized understanding. Pt very pleasant and receptive to education. Pt agrees to phase 2 cardiac rehab. [facility] [place].    [id number]  Joylene GrapesMonge, Harry Bolda C, RN, BSN 05/10/2015 11:41 AM

## 2015-05-10 NOTE — Discharge Summary (Addendum)
Discharge Summary   Patient ID: Harry Bowen,  MRN: [id number], DOB/AGE: [birth year] 64 y.o.  Admit date: 05/08/2015 Discharge date: 05/10/2015  Primary Care Provider: Estanislado PandySASSER,PAUL W Primary Cardiologist: New - will follow-up with Junius ArgyleS. Koneswaran, MD in [place].  Discharge Diagnoses Principal Problem:   ST elevation myocardial infarction (STEMI) involving left anterior descending (LAD) coronary artery with complication  **s/p PCI/DES of the LAD this admission.  Active Problems:   CAD (coronary artery disease)   Essential hypertension   Diabetes mellitus   Dyslipidemia   Ischemic Cardiomyopathy  **EF 45% by left ventriculography this admission.  Allergies No Known Allergies  Procedures  Cardiac Catheterization and Percutaneous Coronary Intervention 5.10.2016  Coronary Findings     Dominance: Right    Left Anterior Descending  Dist LAD filled by collaterals from RPDA.   . Prox LAD lesion, 100% stenosed. , and has right-to-left collateral flow.   . **The LAD was successfully stented using a 3.5 x 23 mm Xience drug-eluting stent.**        Left Circumflex  The vessel was is angiographically normal.      Right Coronary Artery  The vessel was is angiographically normal.     Left Ventricle The left ventricular size is normal. There is mild left ventricular systolic dysfunction. The ejection fraction is calculated to be 45%. Ejection fraction quantitative: 45%. There are wall motion abnormalities in the left ventricle. _____________   History of Present Illness  64 y/o male with a h/o type II diabetes mellitus and hypertension.  He had no prior cardiac history.  He was in his usoh until approximately 4 days prior to admission when he began to experience constant, low level chest discomfort that gradually worsened.  He had no associated symptoms.  On 5/10, he was seen by his PCP and was directed to [facility] where his troponin was found to be 10.43 and he had anterior ST  elevation on ECG. He was treated with ASA, ntg, and heparin, and [facility] for emergent diagnostic catheter[facility].  Hospital Course  Pt underwent emergent diagnostic catheterization on 5/10 revealing a totally occluded LAD with right to left collaterals.  He otherwise had normal coronary arteries.  EF was 45%.  The LAD was successfully stented using a 3.5 x 23 Xience DES.  He tolerated procedure well and post-procedure was monitored in the coronary intensive care unit where he eventually peaked his troponin @ a value >65.  He was placed on asa, brilinta, beta blocker, and high potency statin therapy.  He was seen by cardiac rehab and ambulated without difficulty on 5/11, and was thus transferred to the floor.  He has continued to feel well and will be discharged home today in good condition.  We have arranged for early follow-up in our [place] office next week.  Discharge Vitals Blood pressure 113/74, pulse 86, temperature 98.7 F (37.1 C), temperature source Oral, resp. rate 17, height 6\' 1"  (1.854 m), weight 183 lb 11.2 oz (83.326 kg), SpO2 97 %.  Filed Weights   05/08/15 1330 05/09/15 1056 05/10/15 0548  Weight: 201 lb 11.5 oz (91.5 kg) 199 lb 3.2 oz (90.357 kg) 183 lb 11.2 oz (83.326 kg)   Labs  CBC  Recent Labs  05/08/15 1050 05/08/15 1057 05/09/15 0645  WBC 15.4*  --  12.6*  HGB 15.2 16.7 13.4  HCT 44.8 49.0 39.3  MCV 90.9  --  89.5  PLT 302  --  253   Basic Metabolic Panel  Recent Labs  05/09/15 0645 05/10/15 0414  NA 134* 134*  K 4.1 4.0  CL 102 101  CO2 24 23  GLUCOSE 167* 163*  BUN 12 16  CREATININE 1.12 1.19  CALCIUM 8.3* 8.3*   Liver Function Tests  Recent Labs  05/08/15 1050 05/10/15 0414  AST 165* 50*  ALT 42 25  ALKPHOS 79 59  BILITOT 0.9 0.9  PROT 8.0 6.8  ALBUMIN 4.4 3.1*   Cardiac Enzymes  Recent Labs  05/08/15 1421  TROPONINI >65.00*   Hemoglobin A1C  Recent Labs  05/08/15 1519  HGBA1C 7.3*   Fasting Lipid  Panel  Recent Labs  05/09/15 0645  CHOL 144  HDL 36*  LDLCALC 91  TRIG 87  CHOLHDL 4.0   Disposition  Pt is being discharged home today in good condition.  Follow-up Plans & Appointments  Follow-up Information    Follow up with Estanislado PandySASSER,PAUL W, MD.   Specialty:  Family Medicine   Why:  as scheduled.   Contact information:   [address] [place] [address] [phone]       Follow up with Laqueta LindenKONESWARAN, SURESH A, MD On 05/14/2015.   Specialty:  Cardiology   Why:  3:00   Contact information:   [address] [place] [address] [phone]       Discharge Medications    Medication List    STOP taking these medications        lisinopril 10 MG tablet  Commonly known as:  PRINIVIL,ZESTRIL      TAKE these medications        aspirin 81 MG EC tablet  Take 1 tablet (81 mg total) by mouth daily.     atorvastatin 80 MG tablet  Commonly known as:  LIPITOR  Take 1 tablet (80 mg total) by mouth daily at 6 PM.     metFORMIN 500 MG 24 hr tablet  Commonly known as:  GLUCOPHAGE-XR  Take 1,000 mg by mouth at bedtime.     metoprolol succinate 25 MG 24 hr tablet  Commonly known as:  TOPROL XL  Take 1 tablet (25 mg total) by mouth daily.     nitroGLYCERIN 0.4 MG SL tablet  Commonly known as:  NITROSTAT  Place 1 tablet (0.4 mg total) under the tongue every 5 (five) minutes as needed for chest pain.     ticagrelor 90 MG Tabs tablet  Commonly known as:  BRILINTA  Take 1 tablet (90 mg total) by mouth 2 (two) times daily.        Outstanding Labs/Studies  F/U lipids/lft's in 6-8 wks. F/U Echo w/in the next 4 wks as an outpt (Echo not performed as inpt, EF 45% by V gram).  Duration of Discharge Encounter   Greater than 30 minutes including physician time.  Signed, Nicolasa Duckinghristopher Berge NP 05/10/2015, 10:44 AM    I have examined the patient and reviewed assessment and plan and discussed with patient. Agree with above as stated. No CHF sx. No arrhythmia.  Stressed importance of DAPT with Brilinta.  F/u in [place].  Aggressive secondary prevention. Continue high-dose statin along with beta blocker.  Check echocardiogram in 4-6 weeks to evaluate left ventricular function.  Jyasia Markoff S.

## 2015-05-10 NOTE — Telephone Encounter (Signed)
Contacted by case mgr @ [facility] that pt just found out that he will require prior authorization for his brilinta 90mg  bid.  I called [facility] and initiated the PA process.  His PA # is [id number].  I was advised by [facility] staff that a decision will be made within 30 minutes.  I called the pts home number and spoke with his wife.  He is not home from the hospital yet.  I advised that we are working on prior authorization and wanted to make sure that he filled the 30 day free brilinta Rx.  They will call back if there were any issues obtaining brilinta.  I called his mobile phone but it went straight to voicemail.

## 2015-05-10 NOTE — Discharge Instructions (Signed)
**  PLEASE REMEMBER TO BRING ALL OF YOUR MEDICATIONS TO EACH OF YOUR FOLLOW-UP OFFICE VISITS. ° °NO HEAVY LIFTING X 4 WEEKS. °NO SEXUAL ACTIVITY X 4 WEEKS. °NO DRIVING X 2 WEEKS. °NO SOAKING BATHS, HOT TUBS, POOLS, ETC., X 7 DAYS. ° °Radial Site Care °Refer to this sheet in the next few weeks. These instructions provide you with information on caring for yourself after your procedure. Your caregiver may also give you more specific instructions. Your treatment has been planned according to current medical practices, but problems sometimes occur. Call your caregiver if you have any problems or questions after your procedure. °HOME CARE INSTRUCTIONS °· You may shower the day after the procedure. Remove the bandage (dressing) and gently wash the site with plain soap and water. Gently pat the site dry.  °· Do not apply powder or lotion to the site.  °· Do not submerge the affected site in water for 3 to 5 days.  °· Inspect the site at least twice daily.  °· Do not flex or bend the affected arm for 24 hours.  °· No lifting over 5 pounds (2.3 kg) for 5 days after your procedure.  °· Do not drive home if you are discharged the same day of the procedure. Have someone else drive you.  ° °What to expect: °· Any bruising will usually fade within 1 to 2 weeks.  °· Blood that collects in the tissue (hematoma) may be painful to the touch. It should usually decrease in size and tenderness within 1 to 2 weeks.  °SEEK IMMEDIATE MEDICAL CARE IF: °· You have unusual pain at the radial site.  °· You have redness, warmth, swelling, or pain at the radial site.  °· You have drainage (other than a small amount of blood on the dressing).  °· You have chills.  °· You have a fever or persistent symptoms for more than 72 hours.  °· You have a fever and your symptoms suddenly get worse.  °· Your arm becomes pale, cool, tingly, or numb.  °· You have heavy bleeding from the site. Hold pressure on the site.  ° °

## 2015-05-10 NOTE — Telephone Encounter (Signed)
TCM  Scheduled to see Dr Purvis SheffieldKoneswaran may 16th.

## 2015-05-10 NOTE — Progress Notes (Signed)
Patient Name: Harry CowperCurtis L Vanosdol Date of Encounter: 05/10/2015   Principal Problem:   ST elevation myocardial infarction (STEMI) involving left anterior descending (LAD) coronary artery with complication Active Problems:   CAD (coronary artery disease)   Essential hypertension   Diabetes mellitus   Dyslipidemia  SUBJECTIVE  No c/p or sob. Ambulating w/o difficulty.  Eager to go home.  CURRENT MEDS . aspirin EC  81 mg Oral Daily  . atorvastatin  80 mg Oral q1800  . insulin aspart  0-9 Units Subcutaneous TID WC  . metoprolol tartrate  12.5 mg Oral BID  . sodium chloride  3 mL Intravenous Q12H  . ticagrelor  90 mg Oral BID    OBJECTIVE  Filed Vitals:   05/09/15 1443 05/09/15 2101 05/10/15 0212 05/10/15 0548  BP: 106/61 107/62 98/66 91/62   Pulse: 87 92 85 78  Temp: 99.2 F (37.3 C) 99.4 F (37.4 C) 98.7 F (37.1 C) 98.7 F (37.1 C)  TempSrc: Oral Oral Oral Oral  Resp: 16 17 18 17   Height:      Weight:    183 lb 11.2 oz (83.326 kg)  SpO2: 100% 95% 98% 97%    Intake/Output Summary (Last 24 hours) at 05/10/15 1008 Last data filed at 05/10/15 0855  Gross per 24 hour  Intake    700 ml  Output    375 ml  Net    325 ml   Filed Weights   05/08/15 1330 05/09/15 1056 05/10/15 0548  Weight: 201 lb 11.5 oz (91.5 kg) 199 lb 3.2 oz (90.357 kg) 183 lb 11.2 oz (83.326 kg)    PHYSICAL EXAM  General: Pleasant, NAD. Neuro: Alert and oriented X 3. Moves all extremities spontaneously. Psych: Normal affect. HEENT:  Normal  Neck: Supple without bruits or JVD. Lungs:  Resp regular and unlabored, CTA. Heart: RRR no s3, s4, or murmurs. Abdomen: Soft, non-tender, non-distended, BS + x 4.  Extremities: No clubbing, cyanosis or edema. DP/PT/Radials 2+ and equal bilaterally. R wrist cath site w/o bleeding/bruit/hematoma.  Accessory Clinical Findings  CBC  Recent Labs  05/08/15 1050 05/08/15 1057 05/09/15 0645  WBC 15.4*  --  12.6*  HGB 15.2 16.7 13.4  HCT 44.8 49.0 39.3    MCV 90.9  --  89.5  PLT 302  --  253   Basic Metabolic Panel  Recent Labs  05/09/15 0645 05/10/15 0414  NA 134* 134*  K 4.1 4.0  CL 102 101  CO2 24 23  GLUCOSE 167* 163*  BUN 12 16  CREATININE 1.12 1.19  CALCIUM 8.3* 8.3*   Liver Function Tests  Recent Labs  05/08/15 1050 05/10/15 0414  AST 165* 50*  ALT 42 25  ALKPHOS 79 59  BILITOT 0.9 0.9  PROT 8.0 6.8  ALBUMIN 4.4 3.1*   Cardiac Enzymes  Recent Labs  05/08/15 1421  TROPONINI >65.00*   Hemoglobin A1C  Recent Labs  05/08/15 1519  HGBA1C 7.3*   Fasting Lipid Panel  Recent Labs  05/09/15 0645  CHOL 144  HDL 36*  LDLCALC 91  TRIG 87  CHOLHDL 4.0   TELE  rsr  Radiology/Studies  No results found.  ASSESSMENT AND PLAN  1.  Acute Anterior STEMI/CAD:  S/p PCI/DES of the prox LAD on 5/10. No recurrent c/p.  Ambulating w/o difficulty.  Seen by cardiac rehab yesterday.  Cont asa, brilinta, bb, statin.  Prob d/c this afternoon.  2.  Ischemic cardiomyopathy:  EF 45% by V gram on admission.  Euvolemic  on exam.  Wt down to 183 lbs (? Accuracy as he has not been receiving diuresis and admission wt listed @ 201 lbs).  Cont bb - consolidate.  No bp room for acei/arb/spiro.  F/U echo as outpt.  We discussed the importance of daily weights, sodium restriction, medication compliance, and symptom reporting and he verbalizes understanding.   3.  Essential HTN:  Stable on bb. Was prev on lisinopril 10 @ home.  No BP room currently to add back.  Will consider resumption when I see him back in clinic next wk given h/o DM.  4.  Dyslipidemia:  LDL 91.  Cont high potency statin.  5. DM II:  A1c 7.3.  On metformin @ home.  Admits to dietary indiscretions.  Resume metformin @ d/c.  Cont statin.  Will consider resuming acei if bp stable as outpt.  Signed, Nicolasa Duckinghristopher Berge NP   I have examined the patient and reviewed assessment and plan and discussed with patient.  Agree with above as stated.  No CHF sx. No  arrhythmia.  Stressed importance of DAPT with Brilinta.  Katora Fini S.

## 2015-05-10 NOTE — Care Management Note (Signed)
Case Management Note  Patient Details  Name: Harry Bowen MRN: [id number] Date of Birth: 10-06-1951  Subjective/Objective:      Pt admitted on 05/08/15 with STEMI s/p stent.  PTA, pt resides at home with spouse.                Action/Plan: Pt to dc on Brilinta therapy.  $50 copay-Prior auth required [id number]  Pt given free trial card for Brilinta and copay card, which should reduce copay to $18/month for one year.    Expected Discharge Date:                  Expected Discharge Plan:  Home/Self Care  In-House Referral:     Discharge planning Services  CM Consult, Medication Assistance  Post Acute Care Choice:    Choice offered to:     DME Arranged:    DME Agency:     HH Arranged:    HH Agency:     Status of Service:  Completed, signed off  Medicare Important Message Given:  No Date Medicare IM Given:    Medicare IM give by:    Date Additional Medicare IM Given:    Additional Medicare Important Message give by:     If discussed at Long Length of Stay Meetings, dates discussed:    Additional Comments:  Harry Bowen, Harry Witham M, RN 05/10/2015, 2:42 PM [phone]

## 2015-05-11 ENCOUNTER — Telehealth: Payer: Self-pay

## 2015-05-11 NOTE — Telephone Encounter (Signed)
Received fax approval for Brilinta 90mg  [facility]. Good for 1 year. 05/09/2016. [id number].

## 2015-05-11 NOTE — Telephone Encounter (Signed)
Fax received from [facility]  With approval for Brilinta 90mg  good thru 05/09/2016. [id number].

## 2015-05-11 NOTE — Telephone Encounter (Signed)
[facility].    Patient understands to follow up with Dr. Purvis SheffieldKoneswaran on Monday, 05/14/2015 at 3:[facility] [place] [facility].   Patient understands discharge instructions? Yes Patient understands medications and regiment? Yes Patient understands to bring all medications to this visit? Yes

## 2015-05-14 ENCOUNTER — Ambulatory Visit (INDEPENDENT_AMBULATORY_CARE_PROVIDER_SITE_OTHER): Payer: 59 | Admitting: Cardiovascular Disease

## 2015-05-14 ENCOUNTER — Encounter: Payer: Self-pay | Admitting: Cardiovascular Disease

## 2015-05-14 VITALS — BP 120/80 | HR 82 | Ht 73.0 in | Wt 199.0 lb

## 2015-05-14 DIAGNOSIS — Z713 Dietary counseling and surveillance: Secondary | ICD-10-CM

## 2015-05-14 DIAGNOSIS — I255 Ischemic cardiomyopathy: Secondary | ICD-10-CM

## 2015-05-14 DIAGNOSIS — Z7182 Exercise counseling: Secondary | ICD-10-CM

## 2015-05-14 DIAGNOSIS — I2101 ST elevation (STEMI) myocardial infarction involving left main coronary artery: Secondary | ICD-10-CM | POA: Diagnosis not present

## 2015-05-14 DIAGNOSIS — E785 Hyperlipidemia, unspecified: Secondary | ICD-10-CM

## 2015-05-14 DIAGNOSIS — I10 Essential (primary) hypertension: Secondary | ICD-10-CM

## 2015-05-14 DIAGNOSIS — Z87898 Personal history of other specified conditions: Secondary | ICD-10-CM

## 2015-05-14 DIAGNOSIS — Z9289 Personal history of other medical treatment: Secondary | ICD-10-CM

## 2015-05-14 DIAGNOSIS — Z719 Counseling, unspecified: Secondary | ICD-10-CM

## 2015-05-14 MED ORDER — SIMVASTATIN 40 MG PO TABS: 40.0000 mg | ORAL_TABLET | Freq: Every day | ORAL | Status: DC

## 2015-05-14 MED ORDER — LISINOPRIL 2.5 MG PO TABS: 2.5000 mg | ORAL_TABLET | Freq: Every day | ORAL | Status: DC

## 2015-05-14 NOTE — Progress Notes (Signed)
Patient ID: Harry Bowen, male   DOB: 1951/12/04, 64 y.o.   MRN: [id number]      SUBJECTIVE: The patient presents for post hospitalization follow-up. He recently sustained an ST elevation myocardial infarction and received a drug-eluting stent to the LAD. Ejection fraction was 45% by left ventriculography,  With moderate to severe hypokinesis of the mid to distal anterolateral wall and apex and distal inferior apex.  The circumflex and right coronary artery were angiographically normal. He also has hypertension, diabetes mellitus, and dyslipidemia.  Lipid panel on 5/11 showed total cholesterol 144, triglycerides 87, HDL 36, LDL 91.  ECG performed in the office today demonstrates normal sinus rhythm with both inferior and lateral wall infarct.  He did not fill his prescription for Lipitor because he believes he took the same medication a few years ago and it caused him severe muscle aches in his legs. He currently denies chest pain, palpitations, leg swelling, and shortness of breath. 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 He has yet to begin cardiac rehabilitation.  He said he had been eating red meat and potatoes on a near daily basis.   Review of Systems: As per "subjective", otherwise negative.  No Known Allergies  Current Outpatient Prescriptions  Medication Sig Dispense Refill  . aspirin EC 81 MG EC tablet Take 1 tablet (81 mg total) by mouth daily.    . metFORMIN (GLUCOPHAGE-XR) 500 MG 24 hr tablet Take 1,000 mg by mouth at bedtime.    . metoprolol succinate (TOPROL XL) 25 MG 24 hr tablet Take 1 tablet (25 mg total) by mouth daily. 30 tablet 6  . nitroGLYCERIN (NITROSTAT) 0.4 MG SL tablet Place 1 tablet (0.4 mg total) under the tongue every 5 (five) minutes as needed for chest pain. 25 tablet 3  . ticagrelor (BRILINTA) 90 MG TABS tablet Take 1 tablet (90 mg total) by mouth 2 (two) times daily. 60 tablet 6   No current facility-administered medications for this  visit.    Past Medical History  Diagnosis Date  . Diabetes mellitus without complication   . Hypertension   . CAD (coronary artery disease)     a. 04/2015 Ant STEMI/PCI:  LM nl, LAD 100p w/ R->L Collats (3.5x23 Xience DES), LCX nl, RCA nl, EF 45%.    Past Surgical History  Procedure Laterality Date  . Cardiac catheterization N/A 05/08/2015    Procedure: Left Heart Cath and Coronary Angiography;  Surgeon: Lennette Biharihomas A Kelly, MD;  [facility];  Service: Cardiovascular;  Laterality: N/A;  . Percutaneous coronary stent intervention (pci-s)  05/08/2015    Procedure: Percutaneous Coronary Stent Intervention (Pci-S);  Surgeon: Lennette Biharihomas A Kelly, MD;  Location: [facility] LAB;  Service: Cardiovascular;;    History   Social History  . Marital Status: Married    Spouse Name: N/A  . Number of Children: N/A  . Years of Education: N/A   Occupational History  . Not on file.   Social History Main Topics  . Smoking status: Never Smoker   . Smokeless tobacco: Never Used  . Alcohol Use: No  . Drug Use: Not on file  . Sexual Activity: Not on file   Other Topics Concern  . Not on file   Social History Narrative     Filed Vitals:   05/14/15 1454  BP: 120/80  Pulse: 82  Height: 6\' 1"  (1.854 m)  Weight: 199 lb (90.266 kg)  SpO2: 99%    PHYSICAL  EXAM General: NAD HEENT: Normal. Neck: No JVD, no thyromegaly. Lungs: Clear to auscultation bilaterally with normal respiratory effort. CV: Nondisplaced PMI.  Regular rate and rhythm, normal S1/S2, no S3/S4, no murmur. No pretibial or periankle edema.  No carotid bruit.  Normal pedal pulses.  Abdomen: Soft, nontender, no hepatosplenomegaly, no distention.  Neurologic: Alert and oriented x 3.  Psych: Normal affect. Skin: Normal. Musculoskeletal: Normal range of motion, no gross deformities. Extremities: No clubbing or cyanosis.   ECG: Most recent ECG reviewed.      ASSESSMENT AND PLAN: 1. CAD with STEMI s/p DES to  LAD: Symptomatically stable. Continue Brilinta 90 mg bid x 1 year with consideration for 60 mg bid indefinitely thereafter based on Pegasus trial data. Continue ASA 81 mg and metoprolol succinate 25 mg. Will try simvastatin 40 mg and lisinopril 2.5 mg. Encouraged to commence cardiac rehabilitation. Dietary counseling provided as well as exercise counseling.  2. Essential hypertension: Well controlled. No changes to therapy indicated.  3. Dyslipidemia: Will try simvastatin 40 mg and repeat lipids in 3 months.   Dispo: f/u 3 months.  Prentice DockerSuresh Koneswaran, M.D., F.A.C.C.

## 2015-05-14 NOTE — Patient Instructions (Signed)
Your physician has recommended you make the following change in your medication:  Start simvastatin 40 mg daily at bedtime. Start lisinopril 2.5 mg daily. Continue all other medications the same. Your physician recommends that you return for a FASTING lipid profile: in 3 months. You lab order is given to your today during your office visit. Your physician recommends that you schedule a follow-up appointment in: 3 months.

## 2015-05-16 ENCOUNTER — Telehealth: Payer: Self-pay | Admitting: Cardiovascular Disease

## 2015-05-16 NOTE — Telephone Encounter (Signed)
Pt presented 5/16 for hosp f/u appt.

## 2015-05-16 NOTE — Telephone Encounter (Signed)
Needs a TOC phone call .. Thanks  °

## 2015-05-17 ENCOUNTER — Telehealth: Payer: Self-pay

## 2015-05-17 NOTE — Telephone Encounter (Signed)
Fax from [facility] approving Brillinta 90mg  good thru 05/09/2016. Pa #[id number].

## 2015-05-18 ENCOUNTER — Encounter: Payer: Self-pay | Admitting: *Deleted

## 2015-05-18 ENCOUNTER — Telehealth: Payer: Self-pay | Admitting: *Deleted

## 2015-05-18 NOTE — Telephone Encounter (Signed)
Requesting note releasing him to go back to work.  Last seen 05/14/2015.  Is this okay, without restrictions ?

## 2015-05-18 NOTE — Telephone Encounter (Signed)
Patient notified.  Will fax note to patient at his request.  Home fax # [phone].

## 2015-05-18 NOTE — Telephone Encounter (Signed)
That would be fine 

## 2015-05-29 ENCOUNTER — Encounter (HOSPITAL_COMMUNITY)
Admission: RE | Admit: 2015-05-29 | Discharge: 2015-05-29 | Disposition: A | Discharge status: Home / Self Care | Payer: 59 | Source: Ambulatory Visit | Attending: Cardiovascular Disease | Admitting: Cardiovascular Disease

## 2015-05-29 VITALS — BP 118/70 | HR 75 | Ht 73.0 in | Wt 198.5 lb

## 2015-05-29 DIAGNOSIS — I252 Old myocardial infarction: Secondary | ICD-10-CM | POA: Diagnosis not present

## 2015-05-29 DIAGNOSIS — I213 ST elevation (STEMI) myocardial infarction of unspecified site: Secondary | ICD-10-CM

## 2015-05-29 DIAGNOSIS — Z955 Presence of coronary angioplasty implant and graft: Secondary | ICD-10-CM

## 2015-05-29 DIAGNOSIS — I2510 Atherosclerotic heart disease of native coronary artery without angina pectoris: Secondary | ICD-10-CM | POA: Diagnosis not present

## 2015-05-29 NOTE — Progress Notes (Signed)
Cardiac/Pulmonary Rehab Medication Review by a Pharmacist  Does the patient  feel that his/her medications are working for him/her?  yes  Has the patient been experiencing any side effects to the medications prescribed?  no  Does the patient measure his/her own blood pressure or blood glucose at home?  Checks BP and glucose couple times weeklky   Does the patient have any problems obtaining medications due to transportation or finances?   no  Understanding of regimen: excellent Understanding of indications: excellent Potential of compliance: excellent  Questions asked to Determine Patient Understanding of Medication Regimen:  1. What is the name of the medication?  2. What is the medication used for?  3. When should it be taken?  4. How much should be taken?  5. How will you take it?  6. What side effects should you report?  Understanding Defined as: Excellent: All questions above are correct Good: Questions 1-4 are correct Fair: Questions 1-2 are correct  Poor: 1 or none of the above questions are correct   Elson ClanLilliston, Kamira Mellette Michelle 05/29/2015 9:02 AM

## 2015-05-29 NOTE — Progress Notes (Signed)
Patient arrived at 8:00 am. Patient was referred by Dr. Purvis SheffieldKoneswaran post STEMI I21.3 and Stent placement.Z95.1. During orientation advised patient on arrival and appointment times what to wear, what to do before, during and after exercise. Reviewed attendance and class policy. Talked about inclement weather and class consultation policy. Pt is scheduled to start Cardiac Rehab on 05/30/15 at 8:15 am. Pt was advised to come to class 5 minutes before class starts. He was also given instructions on meeting with the dietician and attending the Family Structure classes. Pt is eager to get started. Patient was able to complete the 6 minute walk test. Patient was shown the gym, measured for the equipment. Patient visit was complete at 10:15am.

## 2015-05-30 ENCOUNTER — Encounter (HOSPITAL_COMMUNITY)
Admission: RE | Admit: 2015-05-30 | Discharge: 2015-05-30 | Disposition: A | Discharge status: Home / Self Care | Payer: 59 | Source: Ambulatory Visit | Attending: Cardiovascular Disease | Admitting: Cardiovascular Disease

## 2015-05-30 DIAGNOSIS — I2510 Atherosclerotic heart disease of native coronary artery without angina pectoris: Secondary | ICD-10-CM | POA: Diagnosis not present

## 2015-05-30 DIAGNOSIS — I252 Old myocardial infarction: Secondary | ICD-10-CM | POA: Insufficient documentation

## 2015-06-01 ENCOUNTER — Encounter (HOSPITAL_COMMUNITY)
Admission: RE | Admit: 2015-06-01 | Discharge: 2015-06-01 | Disposition: A | Discharge status: Home / Self Care | Payer: 59 | Source: Ambulatory Visit | Attending: Cardiovascular Disease | Admitting: Cardiovascular Disease

## 2015-06-01 DIAGNOSIS — I252 Old myocardial infarction: Secondary | ICD-10-CM | POA: Diagnosis not present

## 2015-06-01 DIAGNOSIS — I2510 Atherosclerotic heart disease of native coronary artery without angina pectoris: Secondary | ICD-10-CM | POA: Diagnosis not present

## 2015-06-01 NOTE — Progress Notes (Signed)
Cardiac Rehabilitation Program Outcomes Report   Orientation:  05/29/15 Graduate Date:  tbd Discharge Date:  tbd # of sessions completed: 3  Cardiologist: Margaretmary LombardKoneswaran Family MD:  Neita CarpSasser Class Time:  0815  A.  Exercise Program:  Tolerates exercise @ 3.61 METS for 15 minutes and Walk Test Results:  Pre: 2.89  B.  Mental Health:  Good mental attitude  C.  Education/Instruction/Skills  Accurately checks own pulse.  Rest:  81  Exercise:  112  Uses Perceived Exertion Scale and/or Dyspnea Scale  D.  Nutrition/Weight Control/Body Composition:  Adherence to prescribed nutrition program: good    E.  Blood Lipids    Lab Results  Component Value Date   CHOL 144 05/09/2015   HDL 36* 05/09/2015   LDLCALC 91 05/09/2015   TRIG 87 05/09/2015   CHOLHDL 4.0 05/09/2015    F.  Lifestyle Changes:  Making positive lifestyle changes  G.  Symptoms noted with exercise:  Asymptomatic  Report Completed By:  Doretha Sou Makaylah Oddo RN   Comments:  This is patients first week note in [facility].

## 2015-06-04 ENCOUNTER — Encounter (HOSPITAL_COMMUNITY)
Admission: RE | Admit: 2015-06-04 | Discharge: 2015-06-04 | Disposition: A | Discharge status: Home / Self Care | Payer: 59 | Source: Ambulatory Visit | Attending: Cardiovascular Disease | Admitting: Cardiovascular Disease

## 2015-06-04 DIAGNOSIS — I252 Old myocardial infarction: Secondary | ICD-10-CM | POA: Diagnosis not present

## 2015-06-04 DIAGNOSIS — I2510 Atherosclerotic heart disease of native coronary artery without angina pectoris: Secondary | ICD-10-CM | POA: Diagnosis not present

## 2015-06-06 ENCOUNTER — Encounter (HOSPITAL_COMMUNITY)
Admission: RE | Admit: 2015-06-06 | Discharge: 2015-06-06 | Disposition: A | Discharge status: Home / Self Care | Payer: 59 | Source: Ambulatory Visit | Attending: Cardiovascular Disease | Admitting: Cardiovascular Disease

## 2015-06-06 DIAGNOSIS — I252 Old myocardial infarction: Secondary | ICD-10-CM | POA: Diagnosis not present

## 2015-06-06 DIAGNOSIS — I2510 Atherosclerotic heart disease of native coronary artery without angina pectoris: Secondary | ICD-10-CM | POA: Diagnosis not present

## 2015-06-08 ENCOUNTER — Encounter (HOSPITAL_COMMUNITY)
Admission: RE | Admit: 2015-06-08 | Discharge: 2015-06-08 | Disposition: A | Discharge status: Home / Self Care | Payer: 59 | Source: Ambulatory Visit | Attending: Cardiovascular Disease | Admitting: Cardiovascular Disease

## 2015-06-08 DIAGNOSIS — I252 Old myocardial infarction: Secondary | ICD-10-CM | POA: Diagnosis not present

## 2015-06-08 DIAGNOSIS — I2510 Atherosclerotic heart disease of native coronary artery without angina pectoris: Secondary | ICD-10-CM | POA: Diagnosis not present

## 2015-06-11 ENCOUNTER — Encounter (HOSPITAL_COMMUNITY): Payer: 59

## 2015-06-13 ENCOUNTER — Encounter (HOSPITAL_COMMUNITY): Payer: 59

## 2015-06-15 ENCOUNTER — Encounter (HOSPITAL_COMMUNITY): Payer: 59

## 2015-06-18 ENCOUNTER — Encounter (HOSPITAL_COMMUNITY)
Admission: RE | Admit: 2015-06-18 | Discharge: 2015-06-18 | Disposition: A | Discharge status: Home / Self Care | Payer: 59 | Source: Ambulatory Visit | Attending: Cardiovascular Disease | Admitting: Cardiovascular Disease

## 2015-06-18 DIAGNOSIS — I2510 Atherosclerotic heart disease of native coronary artery without angina pectoris: Secondary | ICD-10-CM | POA: Diagnosis not present

## 2015-06-18 DIAGNOSIS — I252 Old myocardial infarction: Secondary | ICD-10-CM | POA: Diagnosis not present

## 2015-06-18 NOTE — Progress Notes (Signed)
Patient was given Individual Home Exercise Plan today, 06/18/15. Handout was reviewed and discussed. Patient verbalized an understanding. 

## 2015-06-20 ENCOUNTER — Encounter (HOSPITAL_COMMUNITY)
Admission: RE | Admit: 2015-06-20 | Discharge: 2015-06-20 | Disposition: A | Discharge status: Home / Self Care | Payer: 59 | Source: Ambulatory Visit | Attending: Cardiovascular Disease | Admitting: Cardiovascular Disease

## 2015-06-20 DIAGNOSIS — I2510 Atherosclerotic heart disease of native coronary artery without angina pectoris: Secondary | ICD-10-CM | POA: Diagnosis not present

## 2015-06-20 DIAGNOSIS — I252 Old myocardial infarction: Secondary | ICD-10-CM | POA: Diagnosis not present

## 2015-06-22 ENCOUNTER — Encounter (HOSPITAL_COMMUNITY): Payer: 59

## 2015-06-25 ENCOUNTER — Encounter (HOSPITAL_COMMUNITY)
Admission: RE | Admit: 2015-06-25 | Discharge: 2015-06-25 | Disposition: A | Discharge status: Home / Self Care | Payer: 59 | Source: Ambulatory Visit | Attending: Cardiovascular Disease | Admitting: Cardiovascular Disease

## 2015-06-25 DIAGNOSIS — I252 Old myocardial infarction: Secondary | ICD-10-CM | POA: Diagnosis not present

## 2015-06-25 DIAGNOSIS — I2510 Atherosclerotic heart disease of native coronary artery without angina pectoris: Secondary | ICD-10-CM | POA: Diagnosis not present

## 2015-06-27 ENCOUNTER — Encounter (HOSPITAL_COMMUNITY)
Admission: RE | Admit: 2015-06-27 | Discharge: 2015-06-27 | Disposition: A | Discharge status: Home / Self Care | Payer: 59 | Source: Ambulatory Visit | Attending: Cardiovascular Disease | Admitting: Cardiovascular Disease

## 2015-06-27 DIAGNOSIS — I2510 Atherosclerotic heart disease of native coronary artery without angina pectoris: Secondary | ICD-10-CM | POA: Diagnosis not present

## 2015-06-27 DIAGNOSIS — I252 Old myocardial infarction: Secondary | ICD-10-CM | POA: Diagnosis not present

## 2015-06-29 ENCOUNTER — Encounter (HOSPITAL_COMMUNITY): Payer: 59

## 2015-07-02 ENCOUNTER — Encounter (HOSPITAL_COMMUNITY): Payer: 59

## 2015-07-04 ENCOUNTER — Encounter (HOSPITAL_COMMUNITY): Payer: 59

## 2015-07-06 ENCOUNTER — Encounter (HOSPITAL_COMMUNITY): Payer: 59

## 2015-07-09 ENCOUNTER — Encounter (HOSPITAL_COMMUNITY)
Admission: RE | Admit: 2015-07-09 | Discharge: 2015-07-09 | Disposition: A | Discharge status: Home / Self Care | Payer: 59 | Source: Ambulatory Visit | Attending: Cardiovascular Disease | Admitting: Cardiovascular Disease

## 2015-07-09 DIAGNOSIS — I252 Old myocardial infarction: Secondary | ICD-10-CM | POA: Diagnosis not present

## 2015-07-09 DIAGNOSIS — I2510 Atherosclerotic heart disease of native coronary artery without angina pectoris: Secondary | ICD-10-CM | POA: Insufficient documentation

## 2015-07-11 ENCOUNTER — Encounter (HOSPITAL_COMMUNITY): Payer: 59

## 2015-07-13 ENCOUNTER — Encounter (HOSPITAL_COMMUNITY): Payer: 59

## 2015-07-16 ENCOUNTER — Encounter (HOSPITAL_COMMUNITY): Payer: 59

## 2015-07-18 ENCOUNTER — Encounter (HOSPITAL_COMMUNITY): Payer: 59

## 2015-07-20 ENCOUNTER — Encounter (HOSPITAL_COMMUNITY): Payer: 59

## 2015-07-23 ENCOUNTER — Encounter (HOSPITAL_COMMUNITY)
Admission: RE | Admit: 2015-07-23 | Discharge: 2015-07-23 | Disposition: A | Discharge status: Home / Self Care | Payer: 59 | Source: Ambulatory Visit | Attending: Cardiovascular Disease | Admitting: Cardiovascular Disease

## 2015-07-23 DIAGNOSIS — I252 Old myocardial infarction: Secondary | ICD-10-CM | POA: Diagnosis not present

## 2015-07-23 DIAGNOSIS — I2510 Atherosclerotic heart disease of native coronary artery without angina pectoris: Secondary | ICD-10-CM | POA: Diagnosis not present

## 2015-07-25 ENCOUNTER — Encounter (HOSPITAL_COMMUNITY): Payer: 59

## 2015-07-27 ENCOUNTER — Encounter (HOSPITAL_COMMUNITY): Payer: 59

## 2015-07-30 ENCOUNTER — Encounter (HOSPITAL_COMMUNITY)
Admission: RE | Admit: 2015-07-30 | Discharge: 2015-07-30 | Disposition: A | Discharge status: Home / Self Care | Payer: Commercial Managed Care - HMO | Source: Ambulatory Visit | Attending: Cardiovascular Disease | Admitting: Cardiovascular Disease

## 2015-07-30 DIAGNOSIS — I2510 Atherosclerotic heart disease of native coronary artery without angina pectoris: Secondary | ICD-10-CM | POA: Insufficient documentation

## 2015-07-30 DIAGNOSIS — I252 Old myocardial infarction: Secondary | ICD-10-CM | POA: Insufficient documentation

## 2015-08-01 ENCOUNTER — Encounter (HOSPITAL_COMMUNITY): Payer: Commercial Managed Care - HMO

## 2015-08-03 ENCOUNTER — Encounter (HOSPITAL_COMMUNITY): Payer: Commercial Managed Care - HMO

## 2015-08-06 ENCOUNTER — Encounter (HOSPITAL_COMMUNITY): Payer: Commercial Managed Care - HMO

## 2015-08-08 ENCOUNTER — Encounter (HOSPITAL_COMMUNITY): Payer: Commercial Managed Care - HMO

## 2015-08-10 ENCOUNTER — Encounter (HOSPITAL_COMMUNITY): Payer: Commercial Managed Care - HMO

## 2015-08-13 ENCOUNTER — Encounter (HOSPITAL_COMMUNITY)
Admission: RE | Admit: 2015-08-13 | Discharge: 2015-08-13 | Disposition: A | Discharge status: Home / Self Care | Payer: Commercial Managed Care - HMO | Source: Ambulatory Visit | Attending: Cardiovascular Disease | Admitting: Cardiovascular Disease

## 2015-08-13 DIAGNOSIS — I2510 Atherosclerotic heart disease of native coronary artery without angina pectoris: Secondary | ICD-10-CM | POA: Diagnosis not present

## 2015-08-13 DIAGNOSIS — I252 Old myocardial infarction: Secondary | ICD-10-CM | POA: Diagnosis not present

## 2015-08-15 ENCOUNTER — Encounter (HOSPITAL_COMMUNITY): Payer: Commercial Managed Care - HMO

## 2015-08-16 ENCOUNTER — Ambulatory Visit (INDEPENDENT_AMBULATORY_CARE_PROVIDER_SITE_OTHER): Payer: Commercial Managed Care - HMO | Admitting: Cardiovascular Disease

## 2015-08-16 ENCOUNTER — Encounter: Payer: Self-pay | Admitting: Cardiovascular Disease

## 2015-08-16 VITALS — BP 160/89 | HR 64 | Ht 73.0 in | Wt 203.0 lb

## 2015-08-16 DIAGNOSIS — I255 Ischemic cardiomyopathy: Secondary | ICD-10-CM

## 2015-08-16 DIAGNOSIS — E785 Hyperlipidemia, unspecified: Secondary | ICD-10-CM | POA: Diagnosis not present

## 2015-08-16 DIAGNOSIS — I10 Essential (primary) hypertension: Secondary | ICD-10-CM | POA: Diagnosis not present

## 2015-08-16 DIAGNOSIS — I2510 Atherosclerotic heart disease of native coronary artery without angina pectoris: Secondary | ICD-10-CM

## 2015-08-16 DIAGNOSIS — I252 Old myocardial infarction: Secondary | ICD-10-CM

## 2015-08-16 NOTE — Progress Notes (Signed)
Patient ID: Harry Bowen, male   DOB: Mar 10, 1951, 64 y.o.   MRN: [id number]      SUBJECTIVE: The patient presents for routine follow-up. He sustained an ST elevation myocardial infarction in 04/2015 and received a drug-eluting stent to the LAD. Ejection fraction was 45% by left ventriculography, With moderate to severe hypokinesis of the mid to distal anterolateral wall and apex and distal inferior apex.  The circumflex and right coronary artery were angiographically normal. He also has hypertension, diabetes mellitus, and dyslipidemia.  Lipid panel on 5/11 showed total cholesterol 144, triglycerides 87, HDL 36, LDL 91.  Says he feels well. Denies chest pain and shortness of breath. Participating in cardiac rehabilitation.    Review of Systems: As per "subjective", otherwise negative.  Allergies  Allergen Reactions  . Lipitor [Atorvastatin] Other (See Comments)    Muscle aches- tolerates Zocor    Current Outpatient Prescriptions  Medication Sig Dispense Refill  . aspirin EC 81 MG tablet Take 81 mg by mouth 2 (two) times daily.    Marland Kitchen lisinopril (ZESTRIL) 2.5 MG tablet Take 1 tablet (2.5 mg total) by mouth daily. 30 tablet 3  . metFORMIN (GLUCOPHAGE-XR) 500 MG 24 hr tablet Take 1,000 mg by mouth at bedtime.    . metoprolol succinate (TOPROL XL) 25 MG 24 hr tablet Take 1 tablet (25 mg total) by mouth daily. 30 tablet 6  . nitroGLYCERIN (NITROSTAT) 0.4 MG SL tablet Place 1 tablet (0.4 mg total) under the tongue every 5 (five) minutes as needed for chest pain. 25 tablet 3  . simvastatin (ZOCOR) 40 MG tablet Take 40 mg by mouth daily.    . ticagrelor (BRILINTA) 90 MG TABS tablet Take 1 tablet (90 mg total) by mouth 2 (two) times daily. 60 tablet 6   No current facility-administered medications for this visit.    Past Medical History  Diagnosis Date  . Diabetes mellitus without complication   . Hypertension   . CAD (coronary artery disease)     a. 04/2015 Ant STEMI/PCI:  LM nl, LAD  100p w/ R->L Collats (3.5x23 Xience DES), LCX nl, RCA nl, EF 45%.    Past Surgical History  Procedure Laterality Date  . Cardiac catheterization N/A 05/08/2015    Procedure: Left Heart Cath and Coronary Angiography;  Surgeon: Lennette Bihari, MD;  Location: [facility] INVASIVE CV LAB;  Service: Cardiovascular;  Laterality: N/A;  . Percutaneous coronary stent intervention (pci-s)  05/08/2015    Procedure: Percutaneous Coronary Stent Intervention (Pci-S);  Surgeon: Lennette Bihari, MD;  Location: [facility] INVASIVE CV LAB;  Service: Cardiovascular;;    Social History   Social History  . Marital Status: Married    Spouse Name: N/A  . Number of Children: N/A  . Years of Education: N/A   Occupational History  . Not on file.   Social History Main Topics  . Smoking status: Never Smoker   . Smokeless tobacco: Never Used  . Alcohol Use: No  . Drug Use: Not on file  . Sexual Activity: Not on file   Other Topics Concern  . Not on file   Social History Narrative     Filed Vitals:   08/16/15 0757  BP: 160/89  Pulse: 64  Height: 6\' 1"  (1.854 m)  Weight: 203 lb (92.08 kg)    PHYSICAL EXAM General: NAD HEENT: Normal. Neck: No JVD, no thyromegaly. Lungs: Clear to auscultation bilaterally with normal respiratory effort. CV: Nondisplaced PMI.  Regular rate and rhythm, normal S1/S2, no S3/S4,  no murmur. No pretibial or periankle edema.  No carotid bruit.  Normal pedal pulses.  Abdomen: Soft, nontender, no hepatosplenomegaly, no distention.  Neurologic: Alert and oriented x 3.  Psych: Normal affect. Skin: Normal. Musculoskeletal: Normal range of motion, no gross deformities. Extremities: No clubbing or cyanosis.   ECG: Most recent ECG reviewed.      ASSESSMENT AND PLAN: 1. CAD with STEMI s/p DES to LAD: Symptomatically stable. Continue Brilinta 90 mg bid x 1 year with consideration for 60 mg bid indefinitely thereafter based on Pegasus trial data. Continue ASA 81 mg and metoprolol succinate  25 mg. Will continue simvastatin 40 mg and lisinopril 2.5 mg. Participating in cardiac rehabilitation. Dietary counseling previously provided as well as exercise counseling.  2. Essential hypertension: Elevated but has routinely been normal at cardiac rehab and at PCP's office. Continue lisinopril 2.5 mg.  3. Dyslipidemia: Continue simvastatin 40 mg. Obtain copy of lipids from PCP.   Dispo: f/u 6 months.   Prentice Docker, M.D., F.A.C.C.

## 2015-08-16 NOTE — Patient Instructions (Signed)
Continue all current medications. Your physician wants you to follow up in: 6 months.  You will receive a reminder letter in the mail one-two months in advance.  If you don't receive a letter, please call our office to schedule the follow up appointment   

## 2015-08-17 ENCOUNTER — Encounter (HOSPITAL_COMMUNITY): Payer: Commercial Managed Care - HMO

## 2015-08-20 ENCOUNTER — Encounter (HOSPITAL_COMMUNITY)
Admission: RE | Admit: 2015-08-20 | Discharge: 2015-08-20 | Disposition: A | Discharge status: Home / Self Care | Payer: Commercial Managed Care - HMO | Source: Ambulatory Visit | Attending: Cardiovascular Disease | Admitting: Cardiovascular Disease

## 2015-08-20 DIAGNOSIS — I252 Old myocardial infarction: Secondary | ICD-10-CM | POA: Diagnosis not present

## 2015-08-20 DIAGNOSIS — I2510 Atherosclerotic heart disease of native coronary artery without angina pectoris: Secondary | ICD-10-CM | POA: Diagnosis not present

## 2015-08-22 ENCOUNTER — Encounter (HOSPITAL_COMMUNITY): Payer: Commercial Managed Care - HMO

## 2015-08-24 ENCOUNTER — Encounter (HOSPITAL_COMMUNITY): Payer: Commercial Managed Care - HMO

## 2015-08-27 ENCOUNTER — Encounter (HOSPITAL_COMMUNITY)
Admission: RE | Admit: 2015-08-27 | Discharge: 2015-08-27 | Disposition: A | Discharge status: Home / Self Care | Payer: Commercial Managed Care - HMO | Source: Ambulatory Visit | Attending: Cardiovascular Disease | Admitting: Cardiovascular Disease

## 2015-08-27 DIAGNOSIS — I2510 Atherosclerotic heart disease of native coronary artery without angina pectoris: Secondary | ICD-10-CM | POA: Diagnosis not present

## 2015-08-27 DIAGNOSIS — I252 Old myocardial infarction: Secondary | ICD-10-CM | POA: Diagnosis not present

## 2015-08-29 ENCOUNTER — Encounter (HOSPITAL_COMMUNITY): Payer: Commercial Managed Care - HMO

## 2015-08-31 ENCOUNTER — Encounter (HOSPITAL_COMMUNITY): Payer: 59

## 2015-09-03 ENCOUNTER — Encounter (HOSPITAL_COMMUNITY): Payer: 59

## 2015-09-05 ENCOUNTER — Encounter (HOSPITAL_COMMUNITY): Payer: 59

## 2015-09-07 ENCOUNTER — Other Ambulatory Visit: Payer: Self-pay | Admitting: Cardiovascular Disease

## 2015-09-07 ENCOUNTER — Encounter (HOSPITAL_COMMUNITY): Payer: 59

## 2015-09-10 ENCOUNTER — Encounter (HOSPITAL_COMMUNITY): Payer: 59

## 2015-09-10 NOTE — Progress Notes (Signed)
Patient is discharged from [facility] program today, September 10, 2015 with 16 sessions. Patient had to stop due to work.

## 2015-09-12 ENCOUNTER — Encounter (HOSPITAL_COMMUNITY): Payer: 59

## 2015-09-14 ENCOUNTER — Encounter (HOSPITAL_COMMUNITY): Payer: 59

## 2015-09-17 ENCOUNTER — Encounter (HOSPITAL_COMMUNITY): Payer: 59

## 2015-09-19 ENCOUNTER — Encounter (HOSPITAL_COMMUNITY): Payer: 59

## 2015-09-21 ENCOUNTER — Encounter (HOSPITAL_COMMUNITY): Payer: 59

## 2015-09-24 ENCOUNTER — Encounter (HOSPITAL_COMMUNITY): Payer: 59

## 2015-09-26 ENCOUNTER — Encounter (HOSPITAL_COMMUNITY): Payer: 59

## 2015-09-28 ENCOUNTER — Encounter (HOSPITAL_COMMUNITY): Payer: 59

## 2015-10-01 ENCOUNTER — Encounter (HOSPITAL_COMMUNITY): Payer: 59

## 2015-10-03 ENCOUNTER — Encounter (HOSPITAL_COMMUNITY): Payer: 59

## 2015-10-05 ENCOUNTER — Encounter (HOSPITAL_COMMUNITY): Admission: RE | Admit: 2015-10-05 | Payer: 59 | Source: Ambulatory Visit

## 2015-11-09 ENCOUNTER — Other Ambulatory Visit: Payer: Self-pay | Admitting: Cardiovascular Disease

## 2015-12-09 ENCOUNTER — Other Ambulatory Visit: Payer: Self-pay | Admitting: Nurse Practitioner

## 2016-01-11 ENCOUNTER — Other Ambulatory Visit: Payer: Self-pay | Admitting: Cardiovascular Disease

## 2016-02-18 ENCOUNTER — Other Ambulatory Visit: Payer: Self-pay | Admitting: Cardiovascular Disease

## 2016-02-22 ENCOUNTER — Other Ambulatory Visit: Payer: Self-pay | Admitting: Cardiovascular Disease

## 2016-02-26 ENCOUNTER — Other Ambulatory Visit: Payer: Self-pay | Admitting: Cardiovascular Disease

## 2016-02-26 ENCOUNTER — Ambulatory Visit (INDEPENDENT_AMBULATORY_CARE_PROVIDER_SITE_OTHER): Payer: Commercial Managed Care - HMO | Admitting: Cardiovascular Disease

## 2016-02-26 ENCOUNTER — Encounter: Payer: Self-pay | Admitting: Cardiovascular Disease

## 2016-02-26 VITALS — BP 128/88 | HR 70 | Ht 73.0 in | Wt 209.0 lb

## 2016-02-26 DIAGNOSIS — I255 Ischemic cardiomyopathy: Secondary | ICD-10-CM | POA: Diagnosis not present

## 2016-02-26 DIAGNOSIS — I2510 Atherosclerotic heart disease of native coronary artery without angina pectoris: Secondary | ICD-10-CM | POA: Diagnosis not present

## 2016-02-26 DIAGNOSIS — E785 Hyperlipidemia, unspecified: Secondary | ICD-10-CM | POA: Diagnosis not present

## 2016-02-26 DIAGNOSIS — I252 Old myocardial infarction: Secondary | ICD-10-CM

## 2016-02-26 DIAGNOSIS — I10 Essential (primary) hypertension: Secondary | ICD-10-CM | POA: Diagnosis not present

## 2016-02-26 MED ORDER — TICAGRELOR 90 MG PO TABS: 90.0000 mg | ORAL_TABLET | Freq: Two times a day (BID) | ORAL | Status: DC

## 2016-02-26 MED ORDER — TICAGRELOR 60 MG PO TABS: 60.0000 mg | ORAL_TABLET | Freq: Two times a day (BID) | ORAL | Status: DC

## 2016-02-26 NOTE — Progress Notes (Signed)
Patient ID: Harry Bowen, male   DOB: January 02, 1951, 65 y.o.   MRN: [id number]      SUBJECTIVE: The patient presents for routine follow-up. He sustained an ST elevation myocardial infarction in 04/2015 and received a drug-eluting stent to the LAD. Ejection fraction was 45% by left ventriculography, with moderate to severe hypokinesis of the mid to distal anterolateral wall and apex and distal inferior apex.  The circumflex and right coronary artery were angiographically normal. He also has hypertension, diabetes mellitus, and dyslipidemia.  Lipid panel on 5/11 showed total cholesterol 144, triglycerides 87, HDL 36, LDL 91.  He is doing well, and denies chest pain, leg swelling, and shortness of breath.   Review of Systems: As per "subjective", otherwise negative.  Allergies  Allergen Reactions  . Lipitor [Atorvastatin] Other (See Comments)    Muscle aches- tolerates Zocor    Current Outpatient Prescriptions  Medication Sig Dispense Refill  . aspirin EC 81 MG tablet Take 81 mg by mouth daily.     Marland Kitchen lisinopril (PRINIVIL,ZESTRIL) 2.5 MG tablet TAKE 1 TABLET BY MOUTH EVERY DAY - PATIENT NEEDS TO SCHEDULE AN APPOINTMENT 15 tablet 0  . metFORMIN (GLUCOPHAGE-XR) 500 MG 24 hr tablet Take 1,000 mg by mouth at bedtime.    . metoprolol succinate (TOPROL-XL) 25 MG 24 hr tablet TAKE 1 TABLET BY MOUTH EVERY DAY 30 tablet 6  . nitroGLYCERIN (NITROSTAT) 0.4 MG SL tablet Place 1 tablet (0.4 mg total) under the tongue every 5 (five) minutes as needed for chest pain. 25 tablet 3  . simvastatin (ZOCOR) 40 MG tablet TAKE 1 TABLET BY MOUTH EVERY DAY 15 tablet 0  . ticagrelor (BRILINTA) 90 MG TABS tablet Take 1 tablet (90 mg total) by mouth 2 (two) times daily. 60 tablet 6   No current facility-administered medications for this visit.    Past Medical History  Diagnosis Date  . Diabetes mellitus without complication (HCC)   . Hypertension   . CAD (coronary artery disease)     a. 04/2015 Ant STEMI/PCI:   LM nl, LAD 100p w/ R->L Collats (3.5x23 Xience DES), LCX nl, RCA nl, EF 45%.    Past Surgical History  Procedure Laterality Date  . Cardiac catheterization N/A 05/08/2015    Procedure: Left Heart Cath and Coronary Angiography;  Surgeon: Lennette Bihari, MD;  Location: [facility] INVASIVE CV LAB;  Service: Cardiovascular;  Laterality: N/A;  . Percutaneous coronary stent intervention (pci-s)  05/08/2015    Procedure: Percutaneous Coronary Stent Intervention (Pci-S);  Surgeon: Lennette Bihari, MD;  Location: [facility] INVASIVE CV LAB;  Service: Cardiovascular;;    Social History   Social History  . Marital Status: Married    Spouse Name: N/A  . Number of Children: N/A  . Years of Education: N/A   Occupational History  . Not on file.   Social History Main Topics  . Smoking status: Never Smoker   . Smokeless tobacco: Never Used  . Alcohol Use: No  . Drug Use: Not on file  . Sexual Activity: Not on file   Other Topics Concern  . Not on file   Social History Narrative     Filed Vitals:   02/26/16 0818  BP: 128/88  Pulse: 70  Height:  (1.854 m)  Weight: 209 lb (94.802 kg)  SpO2: 98%    PHYSICAL EXAM General: NAD HEENT: Normal. Neck: No JVD, no thyromegaly. Lungs: Clear to auscultation bilaterally with normal respiratory effort. CV: Nondisplaced PMI.  Regular rate and rhythm,  normal S1/S2, no S3/S4, no murmur. No pretibial or periankle edema.  No carotid bruit.   Abdomen: Soft, nontender, no distention.  Neurologic: Alert and oriented.  Psych: Normal affect. Skin: Normal. Musculoskeletal: No gross deformities.  ECG: Most recent ECG reviewed.      ASSESSMENT AND PLAN: 1. CAD with STEMI s/p DES to LAD: Symptomatically stable. Continue Brilinta 90 mg bid until 6/1 and then reduce to 60 mg bid indefinitely thereafter based on Pegasus trial data. Continue ASA 81 mg and metoprolol succinate 25 mg. Will continue simvastatin 40 mg and lisinopril 2.5 mg.  2. Essential hypertension:  Controlled. Continue lisinopril 2.5 mg.  3. Dyslipidemia: Continue simvastatin 40 mg. LDL 59 on 08/08/15.   Dispo: f/u 6 months.   Prentice Docker, M.D., F.A.C.C.

## 2016-02-26 NOTE — Patient Instructions (Signed)
   Continue Brilinta  twice a day till May 29, 2016.  After that, decrease to  twice a day .  Please call the office when ready for new prescription.  Continue all other medications.   Your physician wants you to follow up in: 6 months.  You will receive a reminder letter in the mail one-two months in advance.  If you don't receive a letter, please call our office to schedule the follow up appointment

## 2016-06-23 ENCOUNTER — Other Ambulatory Visit: Payer: Self-pay | Admitting: Cardiovascular Disease

## 2016-08-14 ENCOUNTER — Telehealth: Payer: Self-pay | Admitting: *Deleted

## 2016-08-14 NOTE — Telephone Encounter (Signed)
Received fax from Dr. Lillia AbedMalrie Johnson, DDS wanting to know if patient needs to be premedicated prior to dental procedures & if there is a waiting period after stents when dental work can be done.  His stents was done 2016.    Did not see any indication in chart that he needs to be premedicated as no history of endocarditis or valve replacement per guidelines, but will forward to provider for further advice.

## 2016-08-15 NOTE — Telephone Encounter (Signed)
His last stent was over a year, typically that is the waiting time we try to have before stopping either his aspirin or brillinta for procedures if possible. He can have any procedure that is needed, if needed typically aspirin and brillinta are held 5-7 days before if there is a bleeding risk. There is no indication for anitbiotics before   Harry FerryJ Delaney Perona MD

## 2016-08-15 NOTE — Telephone Encounter (Signed)
Noted, will fax back to Dr. Laural BenesJohnson.

## 2016-08-27 ENCOUNTER — Telehealth: Payer: Self-pay | Admitting: Cardiovascular Disease

## 2016-08-27 NOTE — Telephone Encounter (Signed)
Harry Bowen with Harry Bowen DDS called requesting to speak with Harry Bowen in regards to Harry Bowen's upcoming dental procedure on 08-28-16.  Please call [phone]

## 2016-08-27 NOTE — Telephone Encounter (Signed)
[facility] - informed her that was already addressed on 08/15/2016 & faxed back same day.    Faxed again now at her request as she stated they did not receive it.

## 2016-09-08 ENCOUNTER — Encounter: Payer: Self-pay | Admitting: Cardiovascular Disease

## 2016-09-08 ENCOUNTER — Ambulatory Visit (INDEPENDENT_AMBULATORY_CARE_PROVIDER_SITE_OTHER): Payer: Commercial Managed Care - HMO | Admitting: Cardiovascular Disease

## 2016-09-08 VITALS — BP 132/82 | HR 69 | Ht 73.0 in | Wt 195.0 lb

## 2016-09-08 DIAGNOSIS — I252 Old myocardial infarction: Secondary | ICD-10-CM | POA: Diagnosis not present

## 2016-09-08 DIAGNOSIS — I10 Essential (primary) hypertension: Secondary | ICD-10-CM

## 2016-09-08 DIAGNOSIS — E785 Hyperlipidemia, unspecified: Secondary | ICD-10-CM | POA: Diagnosis not present

## 2016-09-08 DIAGNOSIS — I255 Ischemic cardiomyopathy: Secondary | ICD-10-CM

## 2016-09-08 DIAGNOSIS — I2510 Atherosclerotic heart disease of native coronary artery without angina pectoris: Secondary | ICD-10-CM | POA: Diagnosis not present

## 2016-09-08 MED ORDER — TICAGRELOR 60 MG PO TABS: 60.0000 mg | ORAL_TABLET | Freq: Two times a day (BID) | ORAL | 6 refills | Status: DC

## 2016-09-08 MED ORDER — NITROGLYCERIN 0.4 MG SL SUBL: 0.4000 mg | SUBLINGUAL_TABLET | SUBLINGUAL | 3 refills | Status: DC | PRN

## 2016-09-08 NOTE — Patient Instructions (Signed)
Your physician wants you to follow-up in:  1 year with Dr Koneswaran You will receive a reminder letter in the mail two months in advance. If you don't receive a letter, please call our office to schedule the follow-up appointment.    Your physician recommends that you continue on your current medications as directed. Please refer to the Current Medication list given to you today.    If you need a refill on your cardiac medications before your next appointment, please call your pharmacy.     Thank you for choosing [facility] !        

## 2016-09-08 NOTE — Addendum Note (Signed)
Addended by: Marlyn CorporalARLTON, Lamis Behrmann A on: 09/08/2016 04:24 PM   Modules accepted: Orders

## 2016-09-08 NOTE — Progress Notes (Signed)
SUBJECTIVE: The patient presents for routine follow-up. He sustained an ST elevation myocardial infarction in 04/2015 and received a drug-eluting stent to the LAD. Ejection fraction was 45% by left ventriculography, with moderate to severe hypokinesis of the mid to distal anterolateral wall and apex and distal inferior apex.  The circumflex and right coronary artery were angiographically normal. He also has hypertension, diabetes mellitus, and dyslipidemia.  Lipid panel on 05/08/16 showed total cholesterol 144, triglycerides 87, HDL 36, LDL 91.  He is doing well, and denies chest pain, leg swelling, and shortness of breath.   Review of Systems: As per "subjective", otherwise negative.  Allergies  Allergen Reactions  . Lipitor [Atorvastatin] Other (See Comments)    Muscle aches- tolerates Zocor    Current Outpatient Prescriptions  Medication Sig Dispense Refill  . aspirin EC 81 MG tablet Take 81 mg by mouth daily.     Marland Kitchen. lisinopril (PRINIVIL,ZESTRIL) 2.5 MG tablet TAKE 1 TABLET BY MOUTH EVERY DAY - PT NEEDS TO SCHEDULE APPOINTMENT 30 tablet 11  . metFORMIN (GLUCOPHAGE-XR) 500 MG 24 hr tablet Take 1,000 mg by mouth at bedtime.    . metoprolol succinate (TOPROL-XL) 25 MG 24 hr tablet TAKE 1 TABLET BY MOUTH EVERY DAY 30 tablet 6  . nitroGLYCERIN (NITROSTAT) 0.4 MG SL tablet Place 1 tablet (0.4 mg total) under the tongue every 5 (five) minutes as needed for chest pain. 25 tablet 3  . simvastatin (ZOCOR) 40 MG tablet TAKE 1 TABLET BY MOUTH EVERY DAY - PT NEEDS APPOINTMENT 30 tablet 11  . ticagrelor (BRILINTA) 60 MG TABS tablet Take 1 tablet (60 mg total) by mouth 2 (two) times daily. (WILL BEGIN May 29, 2016)    . ticagrelor (BRILINTA) 90 MG TABS tablet Take 1 tablet (90 mg total) by mouth 2 (two) times daily. (STOP May 29, 2016)     No current facility-administered medications for this visit.     Past Medical History:  Diagnosis Date  . CAD (coronary artery disease)    a.  04/2015 Ant STEMI/PCI:  LM nl, LAD 100p w/ R->L Collats (3.5x23 Xience DES), LCX nl, RCA nl, EF 45%.  . Diabetes mellitus without complication (HCC)   . Hypertension     Past Surgical History:  Procedure Laterality Date  . CARDIAC CATHETERIZATION N/A 05/08/2015   Procedure: Left Heart Cath and Coronary Angiography;  Surgeon: Lennette Biharihomas A Kelly, MD;  [facility];  Service: Cardiovascular;  Laterality: N/A;  . PERCUTANEOUS CORONARY STENT INTERVENTION (PCI-S)  05/08/2015   Procedure: Percutaneous Coronary Stent Intervention (Pci-S);  Surgeon: Lennette Biharihomas A Kelly, MD;  [facility];  Service: Cardiovascular;;    Social History   Social History  . Marital status: Married    Spouse name: N/A  . Number of children: N/A  . Years of education: N/A   Occupational History  . Not on file.   Social History Main Topics  . Smoking status: Never Smoker  . Smokeless tobacco: Never Used  . Alcohol use No  . Drug use: Unknown  . Sexual activity: Not on file   Other Topics Concern  . Not on file   Social History Narrative  . No narrative on file     Vitals:   09/08/16 1552  BP: 132/82  Pulse: 69  Weight: 195 lb (88.5 kg)  Height: 6\' 1"  (1.854 m)    PHYSICAL EXAM General: NAD HEENT: Normal. Neck: No JVD, no thyromegaly. Lungs: Clear to auscultation  bilaterally with normal respiratory effort. CV: Nondisplaced PMI.  Regular rate and rhythm, normal S1/S2, no S3/S4, no murmur. No pretibial or periankle edema.  No carotid bruit.   Abdomen: Soft, nontender, no distention.  Neurologic: Alert and oriented.  Psych: Normal affect. Skin: Normal. Musculoskeletal: No gross deformities.    ECG: Most recent ECG reviewed.      ASSESSMENT AND PLAN: 1. CAD with STEMI s/p DES to LAD: Symptomatically stable. Continue Brilinta 60 mg bid indefinitely thereafter based on Pegasus trial data. Continue ASA 81 mg and metoprolol succinate 25 mg. Will continue simvastatin 40 mg  and lisinopril 2.5 mg.  2. Essential hypertension: Controlled. Continue lisinopril 2.5 mg.  3. Dyslipidemia: Lipid panel on 05/08/16 showed total cholesterol 144, triglycerides 87, HDL 36, LDL 91. Continue simvastatin 40 mg.  Dispo: fu 1 year.   Prentice Docker, M.D., F.A.C.C.

## 2016-10-05 ENCOUNTER — Emergency Department (HOSPITAL_COMMUNITY): Payer: Commercial Managed Care - HMO

## 2016-10-05 ENCOUNTER — Emergency Department (HOSPITAL_COMMUNITY)
Admission: EM | Admit: 2016-10-05 | Discharge: 2016-10-06 | Disposition: A | Discharge status: Home / Self Care | Payer: Commercial Managed Care - HMO | Attending: Emergency Medicine | Admitting: Emergency Medicine

## 2016-10-05 ENCOUNTER — Encounter (HOSPITAL_COMMUNITY): Payer: Self-pay

## 2016-10-05 DIAGNOSIS — Z7984 Long term (current) use of oral hypoglycemic drugs: Secondary | ICD-10-CM | POA: Diagnosis not present

## 2016-10-05 DIAGNOSIS — Z79899 Other long term (current) drug therapy: Secondary | ICD-10-CM | POA: Diagnosis not present

## 2016-10-05 DIAGNOSIS — E119 Type 2 diabetes mellitus without complications: Secondary | ICD-10-CM | POA: Insufficient documentation

## 2016-10-05 DIAGNOSIS — R109 Unspecified abdominal pain: Secondary | ICD-10-CM | POA: Diagnosis present

## 2016-10-05 DIAGNOSIS — I2510 Atherosclerotic heart disease of native coronary artery without angina pectoris: Secondary | ICD-10-CM | POA: Insufficient documentation

## 2016-10-05 DIAGNOSIS — Z7982 Long term (current) use of aspirin: Secondary | ICD-10-CM | POA: Diagnosis not present

## 2016-10-05 DIAGNOSIS — I10 Essential (primary) hypertension: Secondary | ICD-10-CM | POA: Insufficient documentation

## 2016-10-05 DIAGNOSIS — N201 Calculus of ureter: Secondary | ICD-10-CM

## 2016-10-05 HISTORY — DX: Disorder of kidney and ureter, unspecified: N28.9

## 2016-10-05 LAB — URINALYSIS, ROUTINE W REFLEX MICROSCOPIC
BILIRUBIN URINE: NEGATIVE
GLUCOSE, UA: NEGATIVE mg/dL
Ketones, ur: 15 mg/dL — AB
Leukocytes, UA: NEGATIVE
Nitrite: NEGATIVE
Protein, ur: 100 mg/dL — AB
Specific Gravity, Urine: 1.025 (ref 1.005–1.030)
pH: 5.5 (ref 5.0–8.0)

## 2016-10-05 LAB — URINE MICROSCOPIC-ADD ON

## 2016-10-05 MED ORDER — ONDANSETRON HCL 4 MG/2ML IJ SOLN
4.0000 mg | Freq: Once | INTRAMUSCULAR | Status: AC
  Administered 2016-10-05: 4 mg via INTRAVENOUS
  Filled 2016-10-05: qty 2

## 2016-10-05 MED ORDER — HYDROMORPHONE HCL 1 MG/ML IJ SOLN
1.0000 mg | Freq: Once | INTRAMUSCULAR | Status: AC
  Administered 2016-10-05: 1 mg via INTRAVENOUS
  Filled 2016-10-05: qty 1

## 2016-10-05 MED ORDER — SODIUM CHLORIDE 0.9 % IV BOLUS (SEPSIS)
1000.0000 mL | Freq: Once | INTRAVENOUS | Status: AC
  Administered 2016-10-05: 1000 mL via INTRAVENOUS

## 2016-10-05 NOTE — ED Provider Notes (Signed)
[facility]-EMERGENCY DEPT Provider Note   CSN: [id number] Arrival date & time: 10/05/16  2145  By signing my name below, I, Rosario Adie, attest that this documentation has been prepared under the direction and in the presence of Glynn Octave, MD. Electronically Signed: Rosario Adie, ED Scribe. 10/05/16. 10:48 PM.  History   Chief Complaint Chief Complaint  Patient presents with  . Flank Pain   The history is provided by the patient. No language interpreter was used.   HPI Comments: Harry Bowen is a 65 y.o. male with a h/o DM, CAD s/p stent placement, and HTN, who presents to the Emergency Department complaining of gradually worsening, 5/10 left-sided flank pain onset ~21 hours ago. He describes his pain as sharp and notes that it is radiating into his groin area and left-sided abdomen. Pt reports associated nausea, emesis, testicular soreness, and frequency secondary to his pain. He additionally notes that he has had decreased PO intake since the onset of his pain, and his nausea/vomiting is exacerbated with attempted food or fluid consumption. He states that he has been taking hot showers with mild relief of his pain; however he notes that that pain will always return. He also has been taking Tylenol with minimal relief. Pt has a h/o of renal calculi and states that his pain today is moderately similar. No PSHx to the abdomen. NKDA. He denies hematuria, fever, dysuria, chest pain, SOB, or any other associated symptoms.  Past Medical History:  Diagnosis Date  . CAD (coronary artery disease)    a. 04/2015 Ant STEMI/PCI:  LM nl, LAD 100p w/ R->L Collats (3.5x23 Xience DES), LCX nl, RCA nl, EF 45%.  . Diabetes mellitus without complication (HCC)   . Hypertension   . Renal disorder    Patient Active Problem List   Diagnosis Date Noted  . Dyslipidemia 05/10/2015  . CAD (coronary artery disease)   . Essential hypertension 05/08/2015  . Diabetes mellitus (HCC) 05/08/2015  .  ST elevation myocardial infarction (STEMI) involving left anterior descending (LAD) coronary artery with complication (HCC) 05/08/2015   Past Surgical History:  Procedure Laterality Date  . CARDIAC CATHETERIZATION N/A 05/08/2015   Procedure: Left Heart Cath and Coronary Angiography;  Surgeon: Lennette Bihari, MD;  Location: [facility] INVASIVE CV LAB;  Service: Cardiovascular;  Laterality: N/A;  . PERCUTANEOUS CORONARY STENT INTERVENTION (PCI-S)  05/08/2015   Procedure: Percutaneous Coronary Stent Intervention (Pci-S);  Surgeon: Lennette Bihari, MD;  Location: [facility] INVASIVE CV LAB;  Service: Cardiovascular;;    Home Medications    Prior to Admission medications   Medication Sig Start Date End Date Taking? Authorizing Provider  aspirin EC 81 MG tablet Take 81 mg by mouth daily.     Historical Provider, MD  lisinopril (PRINIVIL,ZESTRIL) 2.5 MG tablet TAKE 1 TABLET BY MOUTH EVERY DAY - PT NEEDS TO SCHEDULE APPOINTMENT 02/26/16   Laqueta Linden, MD  metFORMIN (GLUCOPHAGE-XR) 500 MG 24 hr tablet Take 1,000 mg by mouth at bedtime.    Historical Provider, MD  metoprolol succinate (TOPROL-XL) 25 MG 24 hr tablet TAKE 1 TABLET BY MOUTH EVERY DAY 06/24/16   Laqueta Linden, MD  nitroGLYCERIN (NITROSTAT) 0.4 MG SL tablet Place 1 tablet (0.4 mg total) under the tongue every 5 (five) minutes as needed for chest pain. 09/08/16   Laqueta Linden, MD  simvastatin (ZOCOR) 40 MG tablet TAKE 1 TABLET BY MOUTH EVERY DAY - PT NEEDS APPOINTMENT 02/26/16   Laqueta Linden, MD  ticagrelor (  BRILINTA) 60 MG TABS tablet Take 1 tablet (60 mg total) by mouth 2 (two) times daily. (WILL BEGIN May 29, 2016) 09/08/16   Laqueta Linden, MD   Family History Family History  Problem Relation Age of Onset  . Other Father     MVA caused death  . Heart attack Paternal Grandfather    Social History Social History  Substance Use Topics  . Smoking status: Never Smoker  . Smokeless tobacco: Never Used  . Alcohol use No    Allergies   Lipitor [atorvastatin]  Review of Systems Review of Systems  A complete 10 system review of systems was obtained and all systems are negative except as noted in the HPI and PMH.   Physical Exam Updated Vital Signs BP 141/92 (BP Location: Right Arm)   Pulse 96   Temp 99.1 F (37.3 C) (Oral)   Resp 18   Ht 6\' 1"  (1.854 m)   Wt 190 lb (86.2 kg)   SpO2 99%   BMI 25.07 kg/m   Physical Exam  Constitutional: He is oriented to person, place, and time. He appears well-developed and well-nourished. No distress.  Appears comfortable  HENT:  Head: Normocephalic and atraumatic.  Mouth/Throat: Oropharynx is clear and moist. No oropharyngeal exudate.  Eyes: Conjunctivae and EOM are normal. Pupils are equal, round, and reactive to light.  Neck: Normal range of motion. Neck supple.  No meningismus.  Cardiovascular: Normal rate, regular rhythm, normal heart sounds and intact distal pulses.   No murmur heard. Pulmonary/Chest: Effort normal and breath sounds normal. No respiratory distress.  Abdominal: Soft. There is no tenderness. There is CVA tenderness (left-sided). There is no rebound and no guarding.  Genitourinary:  Genitourinary Comments: Chaperone present throughout entire exam. No testicular tenderness.   Musculoskeletal: Normal range of motion. He exhibits no edema or tenderness.  Neurological: He is alert and oriented to person, place, and time. No cranial nerve deficit. He exhibits normal muscle tone. Coordination normal.   5/5 strength throughout. CN 2-12 intact.Equal grip strength.   Skin: Skin is warm.  Psychiatric: He has a normal mood and affect. His behavior is normal.  Nursing note and vitals reviewed.  ED Treatments / Results  DIAGNOSTIC STUDIES: Oxygen Saturation is 99% on RA, normal by my interpretation.   COORDINATION OF CARE: 10:47 PM-Discussed next steps with pt. Pt verbalized understanding and is agreeable with the plan.   Labs (all labs  ordered are listed, but only abnormal results are displayed) Labs Reviewed  CBC WITH DIFFERENTIAL/PLATELET - Abnormal; Notable for the following:       Result Value   WBC 16.1 (*)    RBC 4.14 (*)    Hemoglobin 12.6 (*)    HCT 37.2 (*)    Neutro Abs 12.8 (*)    Monocytes Absolute 1.3 (*)    All other components within normal limits  BASIC METABOLIC PANEL - Abnormal; Notable for the following:    Glucose, Bld 140 (*)    BUN 22 (*)    Creatinine, Ser 1.58 (*)    Calcium 8.4 (*)    GFR calc non Af Amer 44 (*)    GFR calc Af Amer 51 (*)    All other components within normal limits  URINALYSIS, ROUTINE W REFLEX MICROSCOPIC (NOT AT [facility]) - Abnormal; Notable for the following:    Color, Urine BROWN (*)    APPearance CLOUDY (*)    Hgb urine dipstick LARGE (*)    Ketones, ur 15 (*)  Protein, ur 100 (*)    All other components within normal limits  URINE MICROSCOPIC-ADD ON - Abnormal; Notable for the following:    Squamous Epithelial / LPF 0-5 (*)    Bacteria, UA RARE (*)    All other components within normal limits    Radiology Ct Renal Stone Study  Result Date: 10/06/2016 CLINICAL DATA:  Acute onset of left flank pain and vomiting. Initial encounter. EXAM: CT ABDOMEN AND PELVIS WITHOUT CONTRAST TECHNIQUE: Multidetector CT imaging of the abdomen and pelvis was performed following the standard protocol without IV contrast. COMPARISON:  MRI of the lumbar spine performed 12/27/2015 FINDINGS: Lower chest: The visualized lung bases are grossly clear. The visualized portions of the mediastinum are unremarkable. Hepatobiliary: The liver is unremarkable in appearance. There is suggestion of tiny stones at the base of the gallbladder. The gallbladder is otherwise unremarkable. The common bile duct remains normal in caliber. Pancreas: The pancreas is within normal limits. Spleen: The spleen is unremarkable in appearance. Adrenals/Urinary Tract: The adrenal glands are unremarkable in appearance.  There is mild left-sided hydronephrosis, with prominence of the left ureter along its entire course. No ureteral stone is seen. A 6 mm stone is noted at the base of the bladder. This may reflect a recently passed stone. Left-sided perinephric stranding is noted. Minimal nonspecific right-sided perinephric stranding is also seen. Tiny nonobstructing left renal stones measure up to 2 mm in size. Stomach/Bowel: The stomach is unremarkable in appearance. The small bowel is within normal limits. The appendix is normal in caliber, without evidence of appendicitis. The colon is unremarkable in appearance. Vascular/Lymphatic: Scattered calcification is seen along the abdominal aorta and its branches. The abdominal aorta is otherwise grossly unremarkable. The inferior vena cava is grossly unremarkable. No retroperitoneal lymphadenopathy is seen. No pelvic sidewall lymphadenopathy is identified. Reproductive: The bladder is mildly distended and otherwise unremarkable. The prostate remains normal in size. Other: No additional soft tissue abnormalities are seen. Musculoskeletal: No acute osseous abnormalities are identified. Endplate sclerotic change and disc space narrowing is noted at L4-L5. The visualized musculature is unremarkable in appearance. IMPRESSION: 1. Mild left-sided hydronephrosis, with diffuse prominence of the left ureter and left-sided perinephric stranding. 6 mm stone noted at the base of the bladder. This most likely reflects a recently passed stone. Would correlate with patient's symptoms. 2. Tiny nonobstructing left renal stones measure up to 2 mm in size. 3. Scattered aortic atherosclerosis noted. Electronically Signed   By: Roanna Raider M.D.   On: 10/06/2016 01:08    Procedures Procedures   Medications Ordered in ED Medications - No data to display  Initial Impression / Assessment and Plan / ED Course  I have reviewed the triage vital signs and the nursing notes.  Pertinent labs & imaging  results that were available during my care of the patient were reviewed by me and considered in my medical decision making (see chart for details).  Clinical Course  L flank pain with vomiting similar to previous kidney stone. Appears comfortable UA with blood, no infection. Leukocytosis appears to be chronic. Cr slightly elevated.  CT with L hydronephrosis and 6 mm stone in bladder. Patient pain free after treatment in the ED.  Tolerating PO.  Treat symptomatically for passed kidney stone. Followup with urology. Return precautions discussed.  Final Clinical Impressions(s) / ED Diagnoses   Final diagnoses:  Flank pain  Ureterolithiasis    New Prescriptions New Prescriptions   No medications on file   I personally performed the  services described in this documentation, which was scribed in my presence. The recorded information has been reviewed and is accurate.     Glynn OctaveStephen Krosby Ritchie, MD 10/06/16 [id number]

## 2016-10-05 NOTE — ED Triage Notes (Signed)
Started hurting this morning around 2 am, hurting in left flank area.  Think it is a kidney stone. Has not seen any blood in his urine.  Can't keep anything down at this time.  Has vomiting when he drinks something.  Pain is radiating down to the groin.

## 2016-10-06 LAB — CBC WITH DIFFERENTIAL/PLATELET
Basophils Absolute: 0 10*3/uL (ref 0.0–0.1)
Basophils Relative: 0 %
Eosinophils Absolute: 0 10*3/uL (ref 0.0–0.7)
Eosinophils Relative: 0 %
HEMATOCRIT: 37.2 % — AB (ref 39.0–52.0)
Hemoglobin: 12.6 g/dL — ABNORMAL LOW (ref 13.0–17.0)
LYMPHS ABS: 2 10*3/uL (ref 0.7–4.0)
Lymphocytes Relative: 12 %
MCH: 30.4 pg (ref 26.0–34.0)
MCHC: 33.9 g/dL (ref 30.0–36.0)
MCV: 89.9 fL (ref 78.0–100.0)
MONOS PCT: 8 %
Monocytes Absolute: 1.3 10*3/uL — ABNORMAL HIGH (ref 0.1–1.0)
NEUTROS PCT: 80 %
Neutro Abs: 12.8 10*3/uL — ABNORMAL HIGH (ref 1.7–7.7)
Platelets: 251 10*3/uL (ref 150–400)
RBC: 4.14 MIL/uL — ABNORMAL LOW (ref 4.22–5.81)
RDW: 12.3 % (ref 11.5–15.5)
WBC: 16.1 10*3/uL — ABNORMAL HIGH (ref 4.0–10.5)

## 2016-10-06 LAB — BASIC METABOLIC PANEL
Anion gap: 7 (ref 5–15)
BUN: 22 mg/dL — ABNORMAL HIGH (ref 6–20)
CHLORIDE: 104 mmol/L (ref 101–111)
CO2: 25 mmol/L (ref 22–32)
CREATININE: 1.58 mg/dL — AB (ref 0.61–1.24)
Calcium: 8.4 mg/dL — ABNORMAL LOW (ref 8.9–10.3)
GFR calc Af Amer: 51 mL/min — ABNORMAL LOW (ref >60)
GFR calc non Af Amer: 44 mL/min — ABNORMAL LOW (ref >60)
Glucose, Bld: 140 mg/dL — ABNORMAL HIGH (ref 65–99)
Potassium: 4 mmol/L (ref 3.5–5.1)
Sodium: 136 mmol/L (ref 135–145)

## 2016-10-06 MED ORDER — IBUPROFEN 800 MG PO TABS: 800.0000 mg | ORAL_TABLET | Freq: Three times a day (TID) | ORAL | 0 refills | Status: DC

## 2016-10-06 MED ORDER — ONDANSETRON HCL 4 MG PO TABS: 4.0000 mg | ORAL_TABLET | Freq: Four times a day (QID) | ORAL | 0 refills | Status: DC

## 2016-10-06 MED ORDER — HYDROCODONE-ACETAMINOPHEN 5-325 MG PO TABS: 1.0000 | ORAL_TABLET | ORAL | 0 refills | Status: DC | PRN

## 2016-10-06 NOTE — Discharge Instructions (Signed)
It looks like you passed a kidney stone into your bladder. Follow up with the urologist this week. Return to the ED if you develop new or worsening symptoms.

## 2017-01-22 ENCOUNTER — Other Ambulatory Visit: Payer: Self-pay | Admitting: Cardiovascular Disease

## 2017-02-23 ENCOUNTER — Other Ambulatory Visit: Payer: Self-pay | Admitting: Cardiovascular Disease

## 2017-04-02 DIAGNOSIS — E782 Mixed hyperlipidemia: Secondary | ICD-10-CM | POA: Diagnosis not present

## 2017-04-02 DIAGNOSIS — I25729 Atherosclerosis of autologous artery coronary artery bypass graft(s) with unspecified angina pectoris: Secondary | ICD-10-CM | POA: Diagnosis not present

## 2017-04-02 DIAGNOSIS — R361 Hematospermia: Secondary | ICD-10-CM | POA: Diagnosis not present

## 2017-04-02 DIAGNOSIS — M545 Low back pain: Secondary | ICD-10-CM | POA: Diagnosis not present

## 2017-04-02 DIAGNOSIS — E1165 Type 2 diabetes mellitus with hyperglycemia: Secondary | ICD-10-CM | POA: Diagnosis not present

## 2017-04-02 DIAGNOSIS — Z6825 Body mass index (BMI) 25.0-25.9, adult: Secondary | ICD-10-CM | POA: Diagnosis not present

## 2017-04-02 DIAGNOSIS — G5791 Unspecified mononeuropathy of right lower limb: Secondary | ICD-10-CM | POA: Diagnosis not present

## 2017-04-02 DIAGNOSIS — I10 Essential (primary) hypertension: Secondary | ICD-10-CM | POA: Diagnosis not present

## 2017-04-10 DIAGNOSIS — R361 Hematospermia: Secondary | ICD-10-CM | POA: Diagnosis not present

## 2017-09-15 ENCOUNTER — Other Ambulatory Visit: Payer: Self-pay | Admitting: Cardiovascular Disease

## 2017-10-07 ENCOUNTER — Ambulatory Visit: Payer: Commercial Managed Care - HMO | Admitting: Urology

## 2017-10-09 ENCOUNTER — Ambulatory Visit: Payer: Commercial Managed Care - HMO | Admitting: Cardiovascular Disease

## 2017-10-14 ENCOUNTER — Other Ambulatory Visit: Payer: Self-pay | Admitting: Cardiovascular Disease

## 2017-10-26 ENCOUNTER — Other Ambulatory Visit: Payer: Self-pay | Admitting: Cardiovascular Disease

## 2017-11-02 ENCOUNTER — Ambulatory Visit: Payer: 59 | Admitting: Cardiovascular Disease

## 2017-11-02 ENCOUNTER — Encounter: Payer: Self-pay | Admitting: Cardiovascular Disease

## 2017-11-02 VITALS — BP 160/90 | HR 64 | Ht 73.0 in | Wt 195.0 lb

## 2017-11-02 DIAGNOSIS — I252 Old myocardial infarction: Secondary | ICD-10-CM

## 2017-11-02 DIAGNOSIS — Z955 Presence of coronary angioplasty implant and graft: Secondary | ICD-10-CM

## 2017-11-02 DIAGNOSIS — I10 Essential (primary) hypertension: Secondary | ICD-10-CM

## 2017-11-02 DIAGNOSIS — E785 Hyperlipidemia, unspecified: Secondary | ICD-10-CM

## 2017-11-02 DIAGNOSIS — I25118 Atherosclerotic heart disease of native coronary artery with other forms of angina pectoris: Secondary | ICD-10-CM | POA: Diagnosis not present

## 2017-11-02 NOTE — Patient Instructions (Addendum)
Medication Instructions:   Stop Brilinta.  Continue all other current medications.  Labwork: none  Testing/Procedures: none  Follow-Up: Your physician wants you to follow up in:  1 year.  You will receive a reminder letter in the mail one-two months in advance.  If you don't receive a letter, please call our office to schedule the follow up appointment   Any Other Special Instructions Will Be Listed Below (If Applicable). Your physician has requested that you regularly monitor and record your blood pressure readings at home. Please take readings 4 x week for 1 month.  Return log to office for MD review.    If you need a refill on your cardiac medications before your next appointment, please call your pharmacy.

## 2017-11-02 NOTE — Progress Notes (Signed)
SUBJECTIVE: The patient presents for routine follow-up for coronary disease. He sustained an ST elevation myocardial infarction in 04/2015 and received a drug-eluting stent to the LAD. Ejection fraction was 45% by left ventriculography, with moderate to severe hypokinesis of the mid to distal anterolateral wall and apex and distal inferior apex.  The circumflex and right coronary artery were angiographically normal. He also has hypertension, diabetes mellitus, and dyslipidemia.  ECG performed in the office today which I personally interpreted demonstrated sinus rhythm with incomplete right bundle branch block and probable left posterior fascicular block.  Blood pressure is 158/102 in our office today but was reportedly normal at 120/70 at his PCPs office within the past few weeks.  He denies chest pain and shortness of breath.  He denies exertional fatigue.  He denies lightheadedness, dizziness, and headaches.  He had a brief episode of palpitations about 2 or 3 weeks ago.  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  His   Review of Systems: As per "subjective", otherwise negative.  Allergies  Allergen Reactions  . Lipitor [Atorvastatin] Other (See Comments)    Muscle aches- tolerates Zocor    Current Outpatient Medications  Medication Sig Dispense Refill  . aspirin EC 81 MG tablet Take 81 mg by mouth daily.     Marland Kitchen HYDROcodone-acetaminophen (NORCO/VICODIN) 5-325 MG tablet Take 1 tablet by mouth every 4 (four) hours as needed. 10 tablet 0  . ibuprofen (ADVIL,MOTRIN) 800 MG tablet Take 1 tablet (800 mg total) by mouth 3 (three) times daily. 21 tablet 0  . lisinopril (PRINIVIL,ZESTRIL) 2.5 MG tablet TAKE 1 TABLET BY MOUTH EVERY DAY 30 tablet 6  . metFORMIN (GLUCOPHAGE-XR) 500 MG 24 hr tablet Take 1,000 mg by mouth at bedtime.    . metoprolol succinate (TOPROL-XL) 25 MG 24 hr tablet TAKE ONE TABLET BY MOUTH EVERY DAY 30 tablet 1    . nitroGLYCERIN (NITROSTAT) 0.4 MG SL tablet Place 1 tablet (0.4 mg total) under the tongue every 5 (five) minutes as needed for chest pain. 25 tablet 3  . ondansetron (ZOFRAN) 4 MG tablet Take 1 tablet (4 mg total) by mouth every 6 (six) hours. 12 tablet 0  . simvastatin (ZOCOR) 40 MG tablet TAKE 1 TABLET BY MOUTH DAILY AT 6 PM. 30 tablet 6  . ticagrelor (BRILINTA) 60 MG TABS tablet Take 1 tablet (60 mg total) by mouth 2 (two) times daily. (WILL BEGIN May 29, 2016) 60 tablet 6   No current facility-administered medications for this visit.     Past Medical History:  Diagnosis Date  . CAD (coronary artery disease)    a. 04/2015 Ant STEMI/PCI:  LM nl, LAD 100p w/ R->L Collats (3.5x23 Xience DES), LCX nl, RCA nl, EF 45%.  . Diabetes mellitus without complication (HCC)   . Hypertension   . Renal disorder     History reviewed. No pertinent surgical history.  Social History   Socioeconomic History  . Marital status: Married    Spouse name: Not on file  . Number of children: Not on file  . Years of education: Not on file  . Highest education level: Not on file  Social Needs  . Financial resource strain: Not on file  . Food insecurity - worry: Not on file  . Food insecurity - inability: Not on file  . Transportation needs - medical: Not on file  . Transportation needs - non-medical: Not on file  Occupational History  . Not on file  Tobacco Use  . Smoking status: Never Smoker  . Smokeless tobacco: Never Used  Substance and Sexual Activity  . Alcohol use: No    Alcohol/week: 0.0 oz  . Drug use: Not on file  . Sexual activity: Not on file  Other Topics Concern  . Not on file  Social History Narrative  . Not on file     Vitals:   11/02/17 1130 11/02/17 1138  BP: (!) 158/102 (!) 160/90  Pulse: 64   SpO2: 95%   Weight: 195 lb (88.5 kg)   Height: 6\' 1"  (1.854 m)     Wt Readings from Last 3 Encounters:  11/02/17 195 lb (88.5 kg)  10/05/16 190 lb (86.2 kg)  09/08/16 195  lb (88.5 kg)     PHYSICAL EXAM General: NAD HEENT: Normal. Neck: No JVD, no thyromegaly. Lungs: Clear to auscultation bilaterally with normal respiratory effort. CV: Regular rate and rhythm, normal S1/S2, no S3/S4, no murmur. No pretibial or periankle edema.  No carotid bruit.   Abdomen: Soft, nontender, no distention.  Neurologic: Alert and oriented.  Psych: Normal affect. Skin: Normal. Musculoskeletal: No gross deformities.    ECG: Most recent ECG reviewed.   Labs: Lab Results  Component Value Date/Time   K 4.0 10/05/2016 11:47 PM   BUN 22 (H) 10/05/2016 11:47 PM   CREATININE 1.58 (H) 10/05/2016 11:47 PM   ALT 25 05/10/2015 04:14 AM   HGB 12.6 (L) 10/05/2016 11:47 PM     Lipids: Lab Results  Component Value Date/Time   LDLCALC 91 05/09/2015 06:45 AM   CHOL 144 05/09/2015 06:45 AM   TRIG 87 05/09/2015 06:45 AM   HDL 36 (L) 05/09/2015 06:45 AM       ASSESSMENT AND PLAN: 1. CAD with STEMI s/p DES to LAD: Symptomatically stable. Continue ASA 81 mg, metoprolol succinate 25 mg, and simvastatin 40 mg.  If blood pressure remains elevated, I will increase lisinopril to 10 mg daily to control blood pressure.  2. Essential hypertension: Markedly elevated. I have asked the patient to check blood pressure readings 4 times per week, at different times throughout the day, in order to get a better approximation of mean BP values. These results will be provided to me at the end of that period so that I can determine if antihypertensive medication titration is indicated.   If blood pressure remains elevated, I will increase lisinopril to 10 mg daily.  3. Dyslipidemia: I will obtain a copy of lipids from PCP.  Continue simvastatin 40 mg.     Disposition: Follow up 1 year   Prentice DockerSuresh Dorothe Elmore, M.D., F.A.C.C.

## 2017-11-03 ENCOUNTER — Encounter: Payer: Self-pay | Admitting: *Deleted

## 2017-12-13 ENCOUNTER — Other Ambulatory Visit: Payer: Self-pay | Admitting: Cardiovascular Disease

## 2018-03-17 ENCOUNTER — Ambulatory Visit: Payer: 59 | Attending: Nurse Practitioner | Admitting: Physical Therapy

## 2018-03-17 DIAGNOSIS — M5440 Lumbago with sciatica, unspecified side: Secondary | ICD-10-CM | POA: Diagnosis not present

## 2018-03-17 DIAGNOSIS — R293 Abnormal posture: Secondary | ICD-10-CM | POA: Diagnosis present

## 2018-03-17 DIAGNOSIS — G8929 Other chronic pain: Secondary | ICD-10-CM | POA: Insufficient documentation

## 2018-03-17 DIAGNOSIS — M6281 Muscle weakness (generalized): Secondary | ICD-10-CM | POA: Insufficient documentation

## 2018-03-17 NOTE — Therapy (Signed)
[facility] [address] [place], [place], [address] Phone: [phone]   Fax:  [phone]  Physical Therapy Evaluation  Patient Details  Name: Harry Bowen MRN: [id number] Date of Birth: 13-Jun-1951 Referring Provider: Anastasia Fiedler   Encounter Date: 03/17/2018  PT End of Session - 03/17/18 0910    Visit Number  1    Number of Visits  12    Date for PT Re-Evaluation  06/15/18    Authorization Type  FOTO EVERY 5TH VISIT    PT Start Time  [phone]    PT Stop Time  0900    PT Time Calculation (min)  43 min    Activity Tolerance  Patient tolerated treatment well    Behavior During Therapy  [facility] for tasks assessed/performed       Past Medical History:  Diagnosis Date  . CAD (coronary artery disease)    a. 04/2015 Ant STEMI/PCI:  LM nl, LAD 100p w/ R->L Collats (3.5x23 Xience DES), LCX nl, RCA nl, EF 45%.  . Diabetes mellitus without complication (HCC)   . Hypertension   . Renal disorder     Past Surgical History:  Procedure Laterality Date  . CARDIAC CATHETERIZATION N/A 05/08/2015   Procedure: Left Heart Cath and Coronary Angiography;  Surgeon: Lennette Bihari, MD;  Location: [facility] INVASIVE CV LAB;  Service: Cardiovascular;  Laterality: N/A;  . PERCUTANEOUS CORONARY STENT INTERVENTION (PCI-S)  05/08/2015   Procedure: Percutaneous Coronary Stent Intervention (Pci-S);  Surgeon: Lennette Bihari, MD;  Location: [facility] INVASIVE CV LAB;  Service: Cardiovascular;;    There were no vitals filed for this visit.   Subjective Assessment - 03/17/18 1914    Subjective  Patient reported low back pain and numbness and tingling down both feet for about 8-10 years. Patient stated it occurred after lifting a heavy object and his back has not been the same since. Patient stated his last MRI from 2016 showed disc herniations at L3-L4, L4-L5, and L5-S1. Patient stated he would like to try conservative treatments before injections or any type of surgery. Patient  states pain is about a 2/10 but feels it is not a limiting factor. Patient has the worst symptoms occur especially getting out of bed in the morning. He feels best while standing. Patients goals would to decrease pain, sleep for greater than 8 hours, improve movement, improve strength, and have less difficulty with home activities and caring for his wife.    Pertinent History  HTN, DM    Limitations  House hold activities;Lifting    Diagnostic tests  MRI 2016 herniated discs L3-L4, L4-L5, L5-S1    Patient Stated Goals  get better, move better    Currently in Pain?  Yes    Pain Score  2     Pain Location  Back    Pain Orientation  Lower    Pain Descriptors / Indicators  Tingling    Pain Type  Chronic pain    Pain Radiating Towards  down to feet    Pain Onset  More than a month ago    Pain Frequency  Intermittent    Aggravating Factors   sleeping at night    Pain Relieving Factors  standing         [facility] PT Assessment - 03/17/18 0001      Assessment   Medical Diagnosis  Herniated disc without myelopathy    Referring Provider  Anastasia Fiedler    Onset Date/Surgical Date  -- ongoing, 8-10  years ago    Next MD Visit  April    Prior Therapy  no      Balance Screen   Has the patient fallen in the past 6 months  No    Has the patient had a decrease in activity level because of a fear of falling?   No    Is the patient reluctant to leave their home because of a fear of falling?   No      Home Public house manager residence    Living Arrangements  Spouse/significant other      Prior Function   Level of Independence  Independent    Vocation  Full time employment      Observation/Other Assessments   Focus on Therapeutic Outcomes (FOTO)   25% limited      Sensation   Light Touch  Appears Intact      Posture/Postural Control   Posture/Postural Control  Postural limitations    Postural Limitations  Rounded Shoulders;Forward head;Decreased lumbar  lordosis;Increased thoracic kyphosis;Flexed trunk      ROM / Strength   AROM / PROM / Strength  AROM;Strength      AROM   Overall AROM Comments  lumbar AROM WFL in all planes      Strength   Overall Strength Comments  Ankle MMT 5/5 PF, IN, EV    Strength Assessment Site  Hip;Knee;Ankle;Lumbar    Right/Left Hip  Right;Left    Right Hip Flexion  4/5    Right Hip Extension  4-/5    Right Hip ABduction  4-/5    Left Hip Flexion  4/5    Left Hip Extension  4/5    Left Hip ABduction  4/5    Right/Left Knee  Right;Left    Right Knee Flexion  4/5    Right Knee Extension  4+/5    Left Knee Flexion  4/5    Left Knee Extension  4+/5    Lumbar Flexion  3/5 Arms foward             Objective measurements completed on examination: See above findings.              PT Education - 03/17/18 1913    Education provided  Yes    Education Details  HEP draw in, prone lay, prone on elbows, scapular retractions    Person(s) Educated  Patient    Methods  Explanation;Handout;Demonstration    Comprehension  Verbalized understanding;Returned demonstration          PT Long Term Goals - 03/17/18 1924      PT LONG TERM GOAL #1   Title  Patient will be independent with HEP    Time  6    Period  Weeks    Status  New      PT LONG TERM GOAL #2   Title  Patient will report no neuological symptoms bilaterally indicating no nerve irritation    Time  6    Period  Weeks    Status  New      PT LONG TERM GOAL #3   Title  Patient will improve bilateral hip MMT to 5/5 to improve stability for functional activities    Time  6    Period  Weeks    Status  New      PT LONG TERM GOAL #4   Title  Patient will demonstrate proper lifting body mechanics to prevent injury while assisting wife with transfers.  Time  6    Period  Weeks    Status  New      PT LONG TERM GOAL #5   Title  Patient will demonstrate 4/5 lumbar flexion core MMT.    Time  6    Period  Weeks    Status  New              Plan - 03/17/18 0912    Clinical Presentation  Evolving    Clinical Decision Making  Low    Rehab Potential  Good    PT Frequency  2x / week    PT Duration  6 weeks    PT Treatment/Interventions  ADLs/Self Care Home Management;Cryotherapy;Electrical Stimulation;Ultrasound;Traction;Moist Heat;Therapeutic exercise;Therapeutic activities;Neuromuscular re-education;Balance training;Manual techniques;Passive range of motion;Taping;Patient/family education    PT Next Visit Plan  Begin with Nustep with emphasis on draw in, core stabilization, postural exercises, and LE strengthening, Modalities PRN for pain relief.    PT Home Exercise Plan  Prone lay, Prone on elbows, scapular retractions, Draw in    Consulted and Agree with Plan of Care  Patient       Patient will benefit from skilled therapeutic intervention in order to improve the following deficits and impairments:  Pain, Decreased strength, Postural dysfunction  Visit Diagnosis: Chronic low back pain with sciatica, sciatica laterality unspecified, unspecified back pain laterality  Muscle weakness (generalized)  Abnormal posture     Problem List Patient Active Problem List   Diagnosis Date Noted  . Dyslipidemia 05/10/2015  . CAD (coronary artery disease)   . Essential hypertension 05/08/2015  . Diabetes mellitus (HCC) 05/08/2015  . ST elevation myocardial infarction (STEMI) involving left anterior descending (LAD) coronary artery with complication (HCC) 05/08/2015   Guss BundeKrystle Staceyann Knouff, PT, DPT 03/17/2018, 7:29 PM  [facility] [address] [place], [place], [address] Phone: [phone]   Fax:  [phone]  Name: Harry Bowen MRN: [id number] Date of Birth: [birth year]

## 2018-03-19 ENCOUNTER — Ambulatory Visit: Payer: 59 | Admitting: *Deleted

## 2018-03-19 DIAGNOSIS — M5440 Lumbago with sciatica, unspecified side: Secondary | ICD-10-CM | POA: Diagnosis not present

## 2018-03-19 DIAGNOSIS — G8929 Other chronic pain: Secondary | ICD-10-CM

## 2018-03-19 DIAGNOSIS — M6281 Muscle weakness (generalized): Secondary | ICD-10-CM

## 2018-03-19 DIAGNOSIS — R293 Abnormal posture: Secondary | ICD-10-CM

## 2018-03-19 NOTE — Patient Instructions (Signed)
Isometric Abdominal   Lying on back with knees bent, tighten stomach by pulling navel down. Hold ____ seconds. Repeat __5__ times per set. Do __5__ sets per session. Do _3-5___ sessions per day.  http://orth.exer.us/1086   Copyright  VHI. All rights reserved.  Bent Leg Lift (Hook-Lying)   Tighten stomach and slowly raise right leg _6___ inches from floor. Keep trunk rigid. Hold _2-3___ seconds. Repeat _10___ times per set. Do _3___ sets per session. Do _2-3___ sessions per day.  http://orth.exer.us/1090   Copyright  VHI. All rights reserved.  Bridging   Slowly raise buttocks from floor, keeping stomach tight. Repeat __10__ times per set. Do _3___ sets per session. Do __2-3__ sessions per day.  http://orth.exer.us/1096   Copyright  VHI. All rights reserved.  Bridging   Slowly raise buttocks from floor, keeping stomach tight. Repeat ____ times per set. Do ____ sets per session. Do ____ sessions per day.  http://orth.exer.us/1096   Copyright  VHI. All rights reserved.  Straight Leg Raise (Prone)   Abdomen and head supported, keep left knee locked and raise leg at hip. Avoid arching low back. Repeat _10___ times per set. Do _3___ sets per session. Do _2-3___ sessions per day.  http://orth.exer.us/1112   Copyright  VHI. All rights reserved.  Opposite Arm / Leg Lift (Prone)   Abdomen and head supported, left knee locked, raise leg and opposite arm ____ inches from floor. Repeat _10___ times per set. Do 3____ sets per session. Do __2-3__ sessions per day.  http://orth.exer.us/1114   Copyright  VHI. All rights reserved.  Combination (Hook-Lying)   Tighten stomach and slowly raise left leg and lower opposite arm over head. Keep trunk rigid. Repeat _10___ times per set. Do _3___ sets per session. Do _2-3___ sessions per day.  http://orth.exer.us/1092   Copyright  VHI. All rights reserved.  Upper / Lower Extremity Extension (All-Fours)   Tighten stomach  and raise right leg and opposite arm. Keep trunk rigid. Repeat __10__ times per set. Do __3__ sets per session. Do _2-3___ sessions per day.  http://orth.exer.us/1118   Copyright  VHI. All rights reserved.   

## 2018-03-19 NOTE — Therapy (Signed)
[facility] [address] [place], [place], [address] Phone: [phone]   Fax:  [phone]  Physical Therapy Treatment  Patient Details  Name: Harry Bowen MRN: [id number] Date of Birth: August 28, 1951 Referring Provider: Anastasia FiedlerBetty Bowen   Encounter Date: 03/19/2018  PT End of Session - 03/19/18 1051    Visit Number  2    Number of Visits  12    Date for PT Re-Evaluation  06/15/18    Authorization Type  FOTO EVERY 5TH VISIT    PT Start Time  1030    PT Stop Time  1120    PT Time Calculation (min)  50 min       Past Medical History:  Diagnosis Date  . CAD (coronary artery disease)    a. 04/2015 Ant STEMI/PCI:  LM nl, LAD 100p w/ R->L Collats (3.5x23 Xience DES), LCX nl, RCA nl, EF 45%.  . Diabetes mellitus without complication (HCC)   . Hypertension   . Renal disorder     Past Surgical History:  Procedure Laterality Date  . CARDIAC CATHETERIZATION N/A 05/08/2015   Procedure: Left Heart Cath and Coronary Angiography;  Surgeon: Lennette Biharihomas A Kelly, MD;  [facility];  Service: Cardiovascular;  Laterality: N/A;  . PERCUTANEOUS CORONARY STENT INTERVENTION (PCI-S)  05/08/2015   Procedure: Percutaneous Coronary Stent Intervention (Pci-S);  Surgeon: Lennette Biharihomas A Kelly, MD;  [facility];  Service: Cardiovascular;;    There were no vitals filed for this visit.  Subjective Assessment - 03/19/18 1043    Subjective  LBP is low today1/10. Worse at night    Pertinent History  HTN, DM    Limitations  House hold activities;Lifting    Diagnostic tests  MRI 2016 herniated discs L3-L4, L4-L5, L5-S1    Patient Stated Goals  get better, move better    Currently in Pain?  Yes    Pain Score  1     Pain Location  Back    Pain Orientation  Lower    Pain Descriptors / Indicators  Tingling    Pain Type  Chronic pain    Pain Onset  More than a month ago    Pain Frequency  Intermittent                No data  recorded       [facility] Adult PT Treatment/Exercise - 03/19/18 0001      Therapeutic Activites    Therapeutic Activities  Lifting;ADL's    Lifting  Power position, kneeling, Log roll, Sleeping using pillows between knees      Exercises   Exercises  Lumbar      Lumbar Exercises: Supine   Ab Set  20 reps Drawin    Bent Knee Raise  20 reps    Dead Bug  20 reps    Bridge  20 reps      Lumbar Exercises: Prone   Straight Leg Raise  20 reps    Opposite Arm/Leg Raise  20 reps                  PT Long Term Goals - 03/17/18 1924      PT LONG TERM GOAL #1   Title  Patient will be independent with HEP    Time  6    Period  Weeks    Status  New      PT LONG TERM GOAL #2   Title  Patient will report no neuological  symptoms bilaterally indicating no nerve irritation    Time  6    Period  Weeks    Status  New      PT LONG TERM GOAL #3   Title  Patient will improve bilateral hip MMT to 5/5 to improve stability for functional activities    Time  6    Period  Weeks    Status  New      PT LONG TERM GOAL #4   Title  Patient will demonstrate proper lifting body mechanics to prevent injury while assisting wife with transfers.    Time  6    Period  Weeks    Status  New      PT LONG TERM GOAL #5   Title  Patient will demonstrate 4/5 lumbar flexion core MMT.    Time  6    Period  Weeks    Status  New            Plan - 03/19/18 1126    Clinical Impression Statement  Pt arrived today in good spirits and minimal pain. Rx focused on core activation exs and body mechanics. He did well with breathing normally and not holding his breath.  He  also did well with power position and understanding  of neutral position. HEP given    Clinical Presentation  Evolving    Clinical Decision Making  Low    Rehab Potential  Good    PT Frequency  2x / week    PT Duration  6 weeks    PT Treatment/Interventions  ADLs/Self Care Home Management;Cryotherapy;Electrical  Stimulation;Ultrasound;Traction;Moist Heat;Therapeutic exercise;Therapeutic activities;Neuromuscular re-education;Balance training;Manual techniques;Passive range of motion;Taping;Patient/family education    PT Next Visit Plan  Begin with Nustep with emphasis on draw in, core stabilization, postural exercises, and LE strengthening, Modalities PRN for pain relief.    PT Home Exercise Plan  Prone lay, Prone on elbows, scapular retractions, Draw in    Consulted and Agree with Plan of Care  Patient       Patient will benefit from skilled therapeutic intervention in order to improve the following deficits and impairments:  Pain, Decreased strength, Postural dysfunction  Visit Diagnosis: Chronic low back pain with sciatica, sciatica laterality unspecified, unspecified back pain laterality  Muscle weakness (generalized)  Abnormal posture     Problem List Patient Active Problem List   Diagnosis Date Noted  . Dyslipidemia 05/10/2015  . CAD (coronary artery disease)   . Essential hypertension 05/08/2015  . Diabetes mellitus (HCC) 05/08/2015  . ST elevation myocardial infarction (STEMI) involving left anterior descending (LAD) coronary artery with complication (HCC) 05/08/2015    Harry Bowen,Harry, PTA 03/19/2018, 11:45 AM  [facility] [address] [place], [place], [address] Phone: [phone]   Fax:  [phone]  Name: Harry Bowen MRN: [id number] Date of Birth: 01/16/1951

## 2018-03-22 ENCOUNTER — Encounter: Payer: Self-pay | Admitting: Physical Therapy

## 2018-03-22 ENCOUNTER — Ambulatory Visit: Payer: 59 | Admitting: Physical Therapy

## 2018-03-22 DIAGNOSIS — R293 Abnormal posture: Secondary | ICD-10-CM

## 2018-03-22 DIAGNOSIS — M5440 Lumbago with sciatica, unspecified side: Secondary | ICD-10-CM | POA: Diagnosis not present

## 2018-03-22 DIAGNOSIS — G8929 Other chronic pain: Secondary | ICD-10-CM

## 2018-03-22 DIAGNOSIS — M6281 Muscle weakness (generalized): Secondary | ICD-10-CM

## 2018-03-22 NOTE — Therapy (Signed)
[facility] [address] [place], [place], [address] Phone: [phone]   Fax:  [phone]  Physical Therapy Treatment  Patient Details  Name: Harry Bowen MRN: [id number] Date of Birth: Dec 27, 1951 Referring Provider: Anastasia FiedlerBetty Marshall   Encounter Date: 03/22/2018  PT End of Session - 03/22/18 0813    Visit Number  3    Number of Visits  12    Date for PT Re-Evaluation  06/15/18    Authorization Type  FOTO EVERY 5TH VISIT    PT Start Time  0729    PT Stop Time  0813    PT Time Calculation (min)  44 min    Activity Tolerance  Patient tolerated treatment well    Behavior During Therapy  [facility] for tasks assessed/performed       Past Medical History:  Diagnosis Date  . CAD (coronary artery disease)    a. 04/2015 Ant STEMI/PCI:  LM nl, LAD 100p w/ R->L Collats (3.5x23 Xience DES), LCX nl, RCA nl, EF 45%.  . Diabetes mellitus without complication (HCC)   . Hypertension   . Renal disorder     Past Surgical History:  Procedure Laterality Date  . CARDIAC CATHETERIZATION N/A 05/08/2015   Procedure: Left Heart Cath and Coronary Angiography;  Surgeon: Lennette Biharihomas A Kelly, MD;  [facility];  Service: Cardiovascular;  Laterality: N/A;  . PERCUTANEOUS CORONARY STENT INTERVENTION (PCI-S)  05/08/2015   Procedure: Percutaneous Coronary Stent Intervention (Pci-S);  Surgeon: Lennette Biharihomas A Kelly, MD;  [facility];  Service: Cardiovascular;;    There were no vitals filed for this visit.  Subjective Assessment - 03/22/18 0732    Subjective  annoyance at night in right LE    Pertinent History  HTN, DM    Limitations  House hold activities;Lifting    Diagnostic tests  MRI 2016 herniated discs L3-L4, L4-L5, L5-S1    Patient Stated Goals  get better, move better    Currently in Pain?  Yes    Pain Score  1     Pain Location  Back    Pain Orientation  Lower    Pain Descriptors / Indicators  Tingling    Pain Type  Chronic pain     Pain Radiating Towards  down to right foot    Pain Onset  More than a month ago    Pain Frequency  Intermittent    Aggravating Factors   at night    Pain Relieving Factors  at night                No data recorded       [facility] Treatment/Exercise - 03/22/18 0001      Self-Care   Self-Care  ADL's;Lifting;Posture;Other Self-Care Comments    Other Self-Care Comments   Posture awareness techniques for caring for wife which he needs to lift her, transfer her and care for her for all her ADL's      Lumbar Exercises: Aerobic   Nustep  10min L4-6 core focus      Lumbar Exercises: Standing   Scapular Retraction  Both;20 reps;Theraband;Limitations    Theraband Level (Scapular Retraction)  Other (comment)    Scapular Retraction Limitations  pink XTS    Shoulder Extension  Strengthening;Both;20 reps;Theraband;Limitations;Other (comment)    Theraband Level (Shoulder Extension)  Other (comment)    Shoulder Extension Limitations  pink XTS (lat pull)      Lumbar Exercises: Supine   Bent Knee Raise  20 reps;3 seconds    Bridge with clamshell  20 reps;3 seconds;Limitations;Other (comment)    Bridge with [employer] Limitations  with red t-band      Lumbar Exercises: Sidelying   Other Sidelying Lumbar Exercises  positional traction x10 min  left side down      Lumbar Exercises: Prone   Straight Leg Raise  10 reps    Opposite Arm/Leg Raise  20 reps;Left arm/Right leg;Right arm/Left leg                  PT Long Term Goals - 03/22/18 4098      PT LONG TERM GOAL #1   Title  Patient will be independent with HEP    Time  6    Period  Weeks    Status  On-going      PT LONG TERM GOAL #2   Title  Patient will report no neuological symptoms bilaterally indicating no nerve irritation    Time  6    Period  Weeks    Status  On-going      PT LONG TERM GOAL #3   Title  Patient will improve bilateral hip MMT to 5/5 to improve stability for functional activities     Time  6    Period  Weeks    Status  On-going      PT LONG TERM GOAL #4   Title  Patient will demonstrate proper lifting body mechanics to prevent injury while assisting wife with transfers.    Time  6    Period  Weeks    Status  On-going      PT LONG TERM GOAL #5   Title  Patient will demonstrate 4/5 lumbar flexion core MMT.    Time  6    Period  Weeks    Status  On-going            Plan - 03/22/18 1191    Clinical Impression Statement  Patient tolerated treatment well today and able to start positional traction with good response. Patient would like to try lumbar mechanical traction next visit. Patient was educated on transfers with self care for wife and positioning. Patient still unsure of technique of getting her in/out of car that would be less stress on his back. Educated on stand pivot transfer today. Patient able to progress core activation strengthening today and issued red T-band. Goals ongoing at this time. Symptoms remain in right LE.     Rehab Potential  Good    Clinical Impairments Affecting Rehab Potential  patient weight 195#    PT Frequency  2x / week    PT Duration  6 weeks    PT Treatment/Interventions  ADLs/Self Care Home Management;Cryotherapy;Electrical Stimulation;Ultrasound;Traction;Moist Heat;Therapeutic exercise;Therapeutic activities;Neuromuscular re-education;Balance training;Manual techniques;Passive range of motion;Taping;Patient/family education    PT Next Visit Plan  cont with draw in, core stabilization, postural exercises, and LE strengthening, Modalities PRN for pain relief. consider mechanical lumbar traction next visit       Patient will benefit from skilled therapeutic intervention in order to improve the following deficits and impairments:  Pain, Decreased strength, Postural dysfunction  Visit Diagnosis: Chronic low back pain with sciatica, sciatica laterality unspecified, unspecified back pain laterality  Muscle weakness  (generalized)  Abnormal posture     Problem List Patient Active Problem List   Diagnosis Date Noted  . Dyslipidemia 05/10/2015  . CAD (coronary artery disease)   . Essential hypertension 05/08/2015  . Diabetes mellitus (HCC) 05/08/2015  .  ST elevation myocardial infarction (STEMI) involving left anterior descending (LAD) coronary artery with complication (HCC) 05/08/2015    Harry Bowen, Harry Bowen 03/22/2018, 8:18 AM  [facility] [address] [place], [place], [address] Phone: [phone]   Fax:  [phone]  Name: Harry Bowen MRN: [id number] Date of Birth: 1951/01/05

## 2018-03-26 ENCOUNTER — Ambulatory Visit: Payer: 59 | Admitting: Physical Therapy

## 2018-03-26 ENCOUNTER — Encounter: Payer: Self-pay | Admitting: Physical Therapy

## 2018-03-26 DIAGNOSIS — M5440 Lumbago with sciatica, unspecified side: Secondary | ICD-10-CM | POA: Diagnosis not present

## 2018-03-26 DIAGNOSIS — M6281 Muscle weakness (generalized): Secondary | ICD-10-CM

## 2018-03-26 DIAGNOSIS — G8929 Other chronic pain: Secondary | ICD-10-CM

## 2018-03-26 DIAGNOSIS — R293 Abnormal posture: Secondary | ICD-10-CM

## 2018-03-26 NOTE — Therapy (Addendum)
[facility] [place], [place], [address] Phone: [phone]   Fax:  [phone]  Physical Therapy Treatment/Discharge  Patient Details  Name: Harry Bowen MRN: [id number] Date of Birth: 12/28/1951 Referring Provider: Jaynee Eagles   Encounter Date: 03/26/2018  PT End of Session - 03/26/18 0743    Visit Number  4    Number of Visits  12    Date for PT Re-Evaluation  06/15/18    Authorization Type  FOTO EVERY 5TH VISIT    PT Start Time  0733    PT Stop Time  0823    PT Time Calculation (min)  50 min    Activity Tolerance  Patient tolerated treatment well    Behavior During Therapy  [facility] for tasks assessed/performed       Past Medical History:  Diagnosis Date  . CAD (coronary artery disease)    a. 04/2015 Ant STEMI/PCI:  LM nl, LAD 100p w/ R->L Collats (3.5x23 Xience DES), LCX nl, RCA nl, EF 45%.  . Diabetes mellitus without complication ([place])   . Hypertension   . Renal disorder     Past Surgical History:  Procedure Laterality Date  . CARDIAC CATHETERIZATION N/A 05/08/2015   Procedure: Left Heart Cath and Coronary Angiography;  Surgeon: Troy Sine, MD;  Location: [place] [facility];  Service: Cardiovascular;  Laterality: N/A;  . PERCUTANEOUS CORONARY STENT INTERVENTION (PCI-S)  05/08/2015   Procedure: Percutaneous Coronary Stent Intervention (Pci-S);  Surgeon: Troy Sine, MD;  Location: [place] [facility];  Service: Cardiovascular;;    There were no vitals filed for this visit.  Subjective Assessment - 03/26/18 0741    Subjective  Reports discomfort like symptoms in R calf and foot.    Pertinent History  HTN, DM    Limitations  House hold activities;Lifting    Diagnostic tests  MRI 2016 herniated discs L3-L4, L4-L5, L5-S1    Patient Stated Goals  get better, move better    Currently in Pain?  Yes    Pain Score  1     Pain Location  Calf    Pain Orientation  Right    Pain Descriptors / Indicators   Discomfort    Pain Type  Chronic pain    Pain Radiating Towards  R foot    Pain Onset  More than a month ago    Pain Frequency  Intermittent    Aggravating Factors   Night, horizontal position         [facility] PT Assessment - 03/26/18 0001      Assessment   Medical Diagnosis  Herniated disc without myelopathy    Next MD Visit  April    Prior Therapy  no            No data recorded       [facility] Adult PT Treatment/Exercise - 03/26/18 0001      Lumbar Exercises: Aerobic   Nustep  L3 x10 min      Lumbar Exercises: Standing   Scapular Retraction  Strengthening;Both;20 reps;Limitations    Scapular Retraction Limitations  Pink XTS    Other Standing Lumbar Exercises  Lat pulldown pink XTS x20 reps    Other Standing Lumbar Exercises  Core punches pink XTS x20 reps      Lumbar Exercises: Supine   Bridge with Ball Squeeze  20 reps      Lumbar Exercises: Sidelying   Clam  Both;20 reps      Lumbar  Exercises: Prone   Opposite Arm/Leg Raise  Right arm/Left leg;Left arm/Right leg;10 reps;3 seconds    Other Prone Lumbar Exercises  Superman x20 reps      Modalities   Modalities  Traction      Traction   Type of Traction  Lumbar    Min (lbs)  5    Max (lbs)  78    Hold Time  99    Rest Time  5    Time  15                  PT Long Term Goals - 03/26/18 0827      PT LONG TERM GOAL #1   Title  Patient will be independent with HEP    Time  6    Period  Weeks    Status  Achieved      PT LONG TERM GOAL #2   Title  Patient will report no neuological symptoms bilaterally indicating no nerve irritation    Time  6    Period  Weeks    Status  On-going      PT LONG TERM GOAL #3   Title  Patient will improve bilateral hip MMT to 5/5 to improve stability for functional activities    Time  6    Period  Weeks    Status  On-going      PT LONG TERM GOAL #4   Title  Patient will demonstrate proper lifting body mechanics to prevent injury while assisting wife with  transfers.    Time  6    Period  Weeks    Status  On-going      PT LONG TERM GOAL #5   Title  Patient will demonstrate 4/5 lumbar flexion core MMT.    Time  6    Period  Weeks    Status  On-going            Plan - 03/26/18 0807    Clinical Impression Statement  Patient tolerated today's treatment well as he was progressed through more lumbar and core strengthening exercises. Patient still experiencing discomfort in R calf with radiation into R foot. Patient able to guided through multipositional strengthening with no complaints of any increased pain. Patient educated regarding rationale for mechanical lumbar traction and the technique. Patient's initial traction session maxed at 78# today. Patient reported compliance with HEP.    Rehab Potential  Good    Clinical Impairments Affecting Rehab Potential  patient weight 195#    PT Frequency  2x / week    PT Duration  6 weeks    PT Treatment/Interventions  ADLs/Self Care Home Management;Cryotherapy;Electrical Stimulation;Ultrasound;Traction;Moist Heat;Therapeutic exercise;Therapeutic activities;Neuromuscular re-education;Balance training;Manual techniques;Passive range of motion;Taping;Patient/family education    PT Next Visit Plan  Patient to be on hold due to work.    PT Home Exercise Plan  Prone lay, Prone on elbows, scapular retractions, Draw in    Consulted and Agree with Plan of Care  Patient       Patient will benefit from skilled therapeutic intervention in order to improve the following deficits and impairments:  Pain, Decreased strength, Postural dysfunction  Visit Diagnosis: Chronic low back pain with sciatica, sciatica laterality unspecified, unspecified back pain laterality  Muscle weakness (generalized)  Abnormal posture     Problem List Patient Active Problem List   Diagnosis Date Noted  . Dyslipidemia 05/10/2015  . CAD (coronary artery disease)   . Essential hypertension 05/08/2015  . Diabetes mellitus  (  [place]) 05/08/2015  . ST elevation myocardial infarction (STEMI) involving left anterior descending (LAD) coronary artery with complication ([place]) 84/21/0312   PHYSICAL THERAPY DISCHARGE SUMMARY  Visits from Start of Care: 4  Current functional level related to goals / functional outcomes: See above   Remaining deficits: See goals   Education / Equipment: HEP  Plan: Patient agrees to discharge.  Patient goals were not met. Patient is being discharged due to not returning since the last visit.  ?????    Gabriela Eves, PT, DPT   [place], [place] 03/26/2018, 8:28 AM  [facility] [address] [place], [place], [address] Phone: [phone]   Fax:  [phone]  Name: Harry Bowen MRN: [id number] Date of Birth: 1951/03/27

## 2018-04-13 ENCOUNTER — Other Ambulatory Visit: Payer: Self-pay | Admitting: Cardiovascular Disease

## 2018-05-10 ENCOUNTER — Other Ambulatory Visit: Payer: Self-pay | Admitting: Cardiovascular Disease

## 2018-07-20 ENCOUNTER — Other Ambulatory Visit: Payer: Self-pay | Admitting: Nurse Practitioner

## 2018-07-20 DIAGNOSIS — M5126 Other intervertebral disc displacement, lumbar region: Secondary | ICD-10-CM

## 2018-07-29 ENCOUNTER — Ambulatory Visit
Admission: RE | Admit: 2018-07-29 | Discharge: 2018-07-29 | Disposition: A | Discharge status: Home / Self Care | Payer: 59 | Source: Ambulatory Visit | Attending: Nurse Practitioner | Admitting: Nurse Practitioner

## 2018-07-29 DIAGNOSIS — M5126 Other intervertebral disc displacement, lumbar region: Secondary | ICD-10-CM

## 2018-08-02 ENCOUNTER — Emergency Department (HOSPITAL_COMMUNITY)

## 2018-08-02 ENCOUNTER — Observation Stay (HOSPITAL_COMMUNITY)
Admission: EM | Admit: 2018-08-02 | Discharge: 2018-08-05 | Disposition: A | Discharge status: Home / Self Care | Attending: Internal Medicine | Admitting: Internal Medicine

## 2018-08-02 ENCOUNTER — Other Ambulatory Visit: Payer: Self-pay

## 2018-08-02 ENCOUNTER — Encounter (HOSPITAL_COMMUNITY): Payer: Self-pay | Admitting: Emergency Medicine

## 2018-08-02 DIAGNOSIS — N179 Acute kidney failure, unspecified: Secondary | ICD-10-CM | POA: Diagnosis not present

## 2018-08-02 DIAGNOSIS — I5032 Chronic diastolic (congestive) heart failure: Secondary | ICD-10-CM | POA: Insufficient documentation

## 2018-08-02 DIAGNOSIS — Z7984 Long term (current) use of oral hypoglycemic drugs: Secondary | ICD-10-CM | POA: Diagnosis not present

## 2018-08-02 DIAGNOSIS — R5383 Other fatigue: Secondary | ICD-10-CM | POA: Insufficient documentation

## 2018-08-02 DIAGNOSIS — Z888 Allergy status to other drugs, medicaments and biological substances status: Secondary | ICD-10-CM | POA: Insufficient documentation

## 2018-08-02 DIAGNOSIS — I4510 Unspecified right bundle-branch block: Secondary | ICD-10-CM | POA: Insufficient documentation

## 2018-08-02 DIAGNOSIS — R531 Weakness: Secondary | ICD-10-CM | POA: Diagnosis present

## 2018-08-02 DIAGNOSIS — I2510 Atherosclerotic heart disease of native coronary artery without angina pectoris: Secondary | ICD-10-CM | POA: Diagnosis not present

## 2018-08-02 DIAGNOSIS — Z79899 Other long term (current) drug therapy: Secondary | ICD-10-CM | POA: Insufficient documentation

## 2018-08-02 DIAGNOSIS — I129 Hypertensive chronic kidney disease with stage 1 through stage 4 chronic kidney disease, or unspecified chronic kidney disease: Secondary | ICD-10-CM | POA: Insufficient documentation

## 2018-08-02 DIAGNOSIS — I252 Old myocardial infarction: Secondary | ICD-10-CM | POA: Diagnosis not present

## 2018-08-02 DIAGNOSIS — E785 Hyperlipidemia, unspecified: Secondary | ICD-10-CM | POA: Diagnosis not present

## 2018-08-02 DIAGNOSIS — E860 Dehydration: Secondary | ICD-10-CM | POA: Diagnosis not present

## 2018-08-02 DIAGNOSIS — E1122 Type 2 diabetes mellitus with diabetic chronic kidney disease: Secondary | ICD-10-CM | POA: Insufficient documentation

## 2018-08-02 DIAGNOSIS — R251 Tremor, unspecified: Secondary | ICD-10-CM | POA: Diagnosis not present

## 2018-08-02 DIAGNOSIS — A938 Other specified arthropod-borne viral fevers: Secondary | ICD-10-CM | POA: Diagnosis not present

## 2018-08-02 DIAGNOSIS — R270 Ataxia, unspecified: Principal | ICD-10-CM

## 2018-08-02 DIAGNOSIS — Z955 Presence of coronary angioplasty implant and graft: Secondary | ICD-10-CM | POA: Insufficient documentation

## 2018-08-02 DIAGNOSIS — N189 Chronic kidney disease, unspecified: Secondary | ICD-10-CM | POA: Diagnosis not present

## 2018-08-02 DIAGNOSIS — I10 Essential (primary) hypertension: Secondary | ICD-10-CM | POA: Diagnosis present

## 2018-08-02 DIAGNOSIS — E119 Type 2 diabetes mellitus without complications: Secondary | ICD-10-CM

## 2018-08-02 LAB — CSF CELL COUNT WITH DIFFERENTIAL
Eosinophils, CSF: NONE SEEN % (ref 0–1)
LYMPHS CSF: NONE SEEN % (ref 40–80)
Monocyte-Macrophage-Spinal Fluid: NONE SEEN % (ref 15–45)
RBC COUNT CSF: 2 /mm3 — AB
RBC Count, CSF: 0 /mm3
SEGMENTED NEUTROPHILS-CSF: NONE SEEN % (ref 0–6)
Tube #: 1
Tube #: 4
WBC CSF: 0 /mm3 (ref 0–5)
WBC CSF: 2 /mm3 (ref 0–5)

## 2018-08-02 LAB — HEPATIC FUNCTION PANEL
ALT: 22 U/L (ref 0–44)
AST: 30 U/L (ref 15–41)
Albumin: 4.1 g/dL (ref 3.5–5.0)
Alkaline Phosphatase: 67 U/L (ref 38–126)
BILIRUBIN DIRECT: 0.2 mg/dL (ref 0.0–0.2)
BILIRUBIN TOTAL: 1.2 mg/dL (ref 0.3–1.2)
Indirect Bilirubin: 1 mg/dL — ABNORMAL HIGH (ref 0.3–0.9)
Total Protein: 7.6 g/dL (ref 6.5–8.1)

## 2018-08-02 LAB — PROTEIN AND GLUCOSE, CSF
Glucose, CSF: 75 mg/dL — ABNORMAL HIGH (ref 40–70)
Total  Protein, CSF: 36 mg/dL (ref 15–45)

## 2018-08-02 LAB — BASIC METABOLIC PANEL
Anion gap: 8 (ref 5–15)
BUN: 15 mg/dL (ref 8–23)
CHLORIDE: 96 mmol/L — AB (ref 98–111)
CO2: 27 mmol/L (ref 22–32)
CREATININE: 1.31 mg/dL — AB (ref 0.61–1.24)
Calcium: 8.5 mg/dL — ABNORMAL LOW (ref 8.9–10.3)
GFR calc Af Amer: >60 mL/min (ref >60)
GFR calc non Af Amer: 54 mL/min — ABNORMAL LOW (ref >60)
Glucose, Bld: 120 mg/dL — ABNORMAL HIGH (ref 70–99)
Potassium: 4 mmol/L (ref 3.5–5.1)
SODIUM: 131 mmol/L — AB (ref 135–145)

## 2018-08-02 LAB — DIFFERENTIAL
BASOS ABS: 0 10*3/uL (ref 0.0–0.1)
BASOS PCT: 0 %
Eosinophils Absolute: 0 10*3/uL (ref 0.0–0.7)
Eosinophils Relative: 0 %
LYMPHS PCT: 23 %
Lymphs Abs: 1.1 10*3/uL (ref 0.7–4.0)
MONOS PCT: 7 %
Monocytes Absolute: 0.3 10*3/uL (ref 0.1–1.0)
NEUTROS ABS: 3.4 10*3/uL (ref 1.7–7.7)
Neutrophils Relative %: 70 %

## 2018-08-02 LAB — CBC
HCT: 39.4 % (ref 39.0–52.0)
Hemoglobin: 13 g/dL (ref 13.0–17.0)
MCH: 30 pg (ref 26.0–34.0)
MCHC: 33 g/dL (ref 30.0–36.0)
MCV: 91 fL (ref 78.0–100.0)
PLATELETS: 185 10*3/uL (ref 150–400)
RBC: 4.33 MIL/uL (ref 4.22–5.81)
RDW: 12.4 % (ref 11.5–15.5)
WBC: 4.9 10*3/uL (ref 4.0–10.5)

## 2018-08-02 LAB — URINALYSIS, ROUTINE W REFLEX MICROSCOPIC
Bacteria, UA: NONE SEEN
Bilirubin Urine: NEGATIVE
GLUCOSE, UA: NEGATIVE mg/dL
Ketones, ur: 5 mg/dL — AB
Leukocytes, UA: NEGATIVE
Nitrite: NEGATIVE
PH: 5 (ref 5.0–8.0)
Protein, ur: NEGATIVE mg/dL
SPECIFIC GRAVITY, URINE: 1.021 (ref 1.005–1.030)

## 2018-08-02 LAB — LACTIC ACID, PLASMA: LACTIC ACID, VENOUS: 0.8 mmol/L (ref 0.5–1.9)

## 2018-08-02 LAB — SEDIMENTATION RATE: SED RATE: 12 mm/h (ref 0–16)

## 2018-08-02 LAB — CBG MONITORING, ED: Glucose-Capillary: 104 mg/dL — ABNORMAL HIGH (ref 70–99)

## 2018-08-02 MED ORDER — SODIUM CHLORIDE 0.9 % IV SOLN
INTRAVENOUS | Status: DC
  Administered 2018-08-02 – 2018-08-04 (×4): via INTRAVENOUS

## 2018-08-02 MED ORDER — LIDOCAINE-EPINEPHRINE (PF) 2 %-1:200000 IJ SOLN
20.0000 mL | Freq: Once | INTRAMUSCULAR | Status: AC
  Administered 2018-08-02: 20 mL
  Filled 2018-08-02: qty 20

## 2018-08-02 MED ORDER — KETOROLAC TROMETHAMINE 30 MG/ML IJ SOLN: 30.0000 mg | Freq: Four times a day (QID) | INTRAMUSCULAR | Status: DC | PRN

## 2018-08-02 MED ORDER — NITROGLYCERIN 0.4 MG SL SUBL: 0.4000 mg | SUBLINGUAL_TABLET | SUBLINGUAL | Status: DC | PRN

## 2018-08-02 MED ORDER — ONDANSETRON HCL 4 MG/2ML IJ SOLN: 4.0000 mg | Freq: Four times a day (QID) | INTRAMUSCULAR | Status: DC | PRN

## 2018-08-02 MED ORDER — METFORMIN HCL ER 500 MG PO TB24
500.0000 mg | ORAL_TABLET | Freq: Every day | ORAL | Status: DC
  Administered 2018-08-03 – 2018-08-04 (×2): 500 mg via ORAL
  Filled 2018-08-02 (×2): qty 1

## 2018-08-02 MED ORDER — DOXYCYCLINE HYCLATE 100 MG IV SOLR
INTRAVENOUS | Status: AC
  Filled 2018-08-02: qty 200

## 2018-08-02 MED ORDER — DEXTROSE 5 % IV SOLN
200.0000 mg | Freq: Once | INTRAVENOUS | Status: AC
  Administered 2018-08-02: 200 mg via INTRAVENOUS
  Filled 2018-08-02: qty 200

## 2018-08-02 MED ORDER — SODIUM CHLORIDE 0.9 % IV BOLUS
1000.0000 mL | Freq: Once | INTRAVENOUS | Status: AC
  Administered 2018-08-02: 1000 mL via INTRAVENOUS

## 2018-08-02 MED ORDER — ONDANSETRON HCL 4 MG PO TABS: 4.0000 mg | ORAL_TABLET | Freq: Four times a day (QID) | ORAL | Status: DC | PRN

## 2018-08-02 MED ORDER — LISINOPRIL 10 MG PO TABS
10.0000 mg | ORAL_TABLET | Freq: Every morning | ORAL | Status: DC
  Administered 2018-08-03 – 2018-08-05 (×3): 10 mg via ORAL
  Filled 2018-08-02 (×3): qty 1

## 2018-08-02 MED ORDER — SODIUM CHLORIDE 0.9 % IV SOLN
100.0000 mg | Freq: Two times a day (BID) | INTRAVENOUS | Status: DC
  Administered 2018-08-03 – 2018-08-04 (×3): 100 mg via INTRAVENOUS
  Filled 2018-08-02 (×5): qty 100

## 2018-08-02 MED ORDER — METOPROLOL SUCCINATE ER 25 MG PO TB24
25.0000 mg | ORAL_TABLET | Freq: Every evening | ORAL | Status: DC
  Administered 2018-08-03 – 2018-08-04 (×2): 25 mg via ORAL
  Filled 2018-08-02 (×2): qty 1

## 2018-08-02 MED ORDER — SIMVASTATIN 20 MG PO TABS
40.0000 mg | ORAL_TABLET | Freq: Every day | ORAL | Status: DC
  Administered 2018-08-03 – 2018-08-04 (×2): 40 mg via ORAL
  Filled 2018-08-02 (×2): qty 2

## 2018-08-02 MED ORDER — ACETAMINOPHEN 325 MG PO TABS
650.0000 mg | ORAL_TABLET | Freq: Once | ORAL | Status: AC
  Administered 2018-08-02: 650 mg via ORAL
  Filled 2018-08-02: qty 2

## 2018-08-02 NOTE — ED Notes (Signed)
Report given to ICU RN at this time.

## 2018-08-02 NOTE — ED Provider Notes (Signed)
[facility] EMERGENCY DEPARTMENT Provider Note   CSN: [id number] Arrival date & time: 08/02/18  1808     History   Chief Complaint Chief Complaint  Patient presents with  . Weakness    HPI Royston CowperCurtis L Michelsen is a 67 y.o. male.  Patient states that he was bit by a tick about 2 weeks ago and approximately a week ago he started having shaking with his right hand and then 4 days ago he had difficulty walking and with mild ataxic.  3 days ago he is just started feeling weak.  The history is provided by the patient. No language interpreter was used.  Weakness  Primary symptoms include no focal weakness. This is a new problem. The current episode started more than 2 days ago. The problem has not changed since onset.There was no focality noted. There has been no fever. Pertinent negatives include no shortness of breath, no chest pain and no headaches. There were no medications administered prior to arrival. Associated medical issues do not include trauma.    Past Medical History:  Diagnosis Date  . CAD (coronary artery disease)    a. 04/2015 Ant STEMI/PCI:  LM nl, LAD 100p w/ R->L Collats (3.5x23 Xience DES), LCX nl, RCA nl, EF 45%.  . Diabetes mellitus without complication ([facility])   . Hypertension   . Renal disorder     Patient Active Problem List   Diagnosis Date Noted  . Dyslipidemia 05/10/2015  . CAD (coronary artery disease)   . Essential hypertension 05/08/2015  . Diabetes mellitus ([facility]) 05/08/2015  . ST elevation myocardial infarction (STEMI) involving left anterior descending (LAD) coronary artery with complication ([facility]) 05/08/2015    Past Surgical History:  Procedure Laterality Date  . CARDIAC CATHETERIZATION N/A 05/08/2015   Procedure: Left Heart Cath and Coronary Angiography;  Surgeon: Lennette Biharihomas A Kelly, MD;  [facility];  Service: Cardiovascular;  Laterality: N/A;  . PERCUTANEOUS CORONARY STENT INTERVENTION (PCI-S)  05/08/2015   Procedure: Percutaneous  Coronary Stent Intervention (Pci-S);  Surgeon: Lennette Biharihomas A Kelly, MD;  [facility];  Service: Cardiovascular;;        Home Medications    Prior to Admission medications   Medication Sig Start Date End Date Taking? Authorizing Provider  aspirin EC 81 MG tablet Take 81 mg by mouth daily.     [provider]  HYDROcodone-acetaminophen (NORCO/VICODIN) 5-325 MG tablet Take 1 tablet by mouth every 4 (four) hours as needed. Patient not taking: Reported on 03/17/2018 10/06/16   Glynn Octaveancour, Stephen, MD  ibuprofen (ADVIL,MOTRIN) 800 MG tablet Take 1 tablet (800 mg total) by mouth 3 (three) times daily. Patient not taking: Reported on 03/17/2018 10/06/16   Rancour, Jeannett SeniorStephen, MD  lisinopril (PRINIVIL,ZESTRIL) 2.5 MG tablet TAKE 1 TABLET BY MOUTH EVERY DAY 10/14/17   Laqueta LindenKoneswaran, Suresh A, MD  metFORMIN (GLUCOPHAGE-XR) 500 MG 24 hr tablet Take 1,000 mg by mouth at bedtime.    [provider]  metoprolol succinate (TOPROL-XL) 25 MG 24 hr tablet TAKE ONE TABLET BY MOUTH EVERY DAY 04/13/18   Laqueta LindenKoneswaran, Suresh A, MD  nitroGLYCERIN (NITROSTAT) 0.4 MG SL tablet Place 1 tablet (0.4 mg total) under the tongue every 5 (five) minutes as needed for chest pain. Patient not taking: Reported on 03/17/2018 09/08/16   Laqueta LindenKoneswaran, Suresh A, MD  ondansetron (ZOFRAN) 4 MG tablet Take 1 tablet (4 mg total) by mouth every 6 (six) hours. Patient not taking: Reported on 03/17/2018 10/06/16   Glynn Octaveancour, Stephen, MD  simvastatin (  ZOCOR) 40 MG tablet TAKE 1 TABLET BY MOUTH DAILY AT 6 PM. 05/10/18   Jonelle Sidle, MD    Family History Family History  Problem Relation Age of Onset  . Other Father        MVA caused death  . Heart attack Paternal Grandfather     Social History Social History   Tobacco Use  . Smoking status: Never Smoker  . Smokeless tobacco: Never Used  Substance Use Topics  . Alcohol use: No    Alcohol/week: 0.0 oz  . Drug use: Never     Allergies   Lipitor  [atorvastatin]   Review of Systems Review of Systems  Constitutional: Negative for appetite change and fatigue.  HENT: Negative for congestion, ear discharge and sinus pressure.   Eyes: Negative for discharge.  Respiratory: Negative for cough and shortness of breath.   Cardiovascular: Negative for chest pain.  Gastrointestinal: Negative for abdominal pain and diarrhea.  Genitourinary: Negative for frequency and hematuria.  Musculoskeletal: Negative for back pain.  Skin: Negative for rash.  Neurological: Positive for weakness. Negative for focal weakness, seizures and headaches.       Tremor with his right hand and weakness in his legs  Psychiatric/Behavioral: Negative for hallucinations.     Physical Exam Updated Vital Signs BP (!) 156/83 (BP Location: Left Arm)   Pulse (!) 101   Temp 98.8 F (37.1 C) (Oral)   Resp 20   Ht 6\' 1"  (1.854 m)   Wt 88.5 kg (195 lb)   SpO2 100%   BMI 25.73 kg/m   Physical Exam  Constitutional: He is oriented to person, place, and time. He appears well-developed.  HENT:  Head: Normocephalic.  Patient's neck is supple.  Eyes: Conjunctivae and EOM are normal. No scleral icterus.  Neck: Neck supple. No thyromegaly present.  Cardiovascular: Normal rate and regular rhythm. Exam reveals no gallop and no friction rub.  No murmur heard. Pulmonary/Chest: No stridor. He has no wheezes. He has no rales. He exhibits no tenderness.  Abdominal: He exhibits no distension. There is no tenderness. There is no rebound.  Musculoskeletal: Normal range of motion. He exhibits no edema.  Mild tremor with right hand and mild ataxia walking  Lymphadenopathy:    He has no cervical adenopathy.  Neurological: He is oriented to person, place, and time. He exhibits normal muscle tone. Coordination normal.  Skin: No rash noted. No erythema.  Psychiatric: He has a normal mood and affect. His behavior is normal.     ED Treatments / Results  Labs (all labs ordered are  listed, but only abnormal results are displayed) Labs Reviewed  BASIC METABOLIC PANEL - Abnormal; Notable for the following components:      Result Value   Sodium 131 (*)    Chloride 96 (*)    Glucose, Bld 120 (*)    Creatinine, Ser 1.31 (*)    Calcium 8.5 (*)    GFR calc non Af Amer 54 (*)    All other components within normal limits  URINALYSIS, ROUTINE W REFLEX MICROSCOPIC - Abnormal; Notable for the following components:   Hgb urine dipstick MODERATE (*)    Ketones, ur 5 (*)    All other components within normal limits  HEPATIC FUNCTION PANEL - Abnormal; Notable for the following components:   Indirect Bilirubin 1.0 (*)    All other components within normal limits  CBG MONITORING, ED - Abnormal; Notable for the following components:   Glucose-Capillary 104 (*)  All other components within normal limits  CULTURE, BLOOD (ROUTINE X 2)  CULTURE, BLOOD (ROUTINE X 2)  CSF CULTURE  CBC  DIFFERENTIAL  SEDIMENTATION RATE  LACTIC ACID, PLASMA  LYME DISEASE DNA BY PCR(BORRELIA BURG)  ROCKY MTN SPOTTED FVR ABS PNL(IGG+IGM)  EHRLICHIA ANTIBODY PANEL  CSF CELL COUNT WITH DIFFERENTIAL  CSF CELL COUNT WITH DIFFERENTIAL  PROTEIN AND GLUCOSE, CSF  B. BURGDORFI ANTIBODIES, CSF  LYME DISEASE DNA BY PCR(BORRELIA BURG)  B. BURGDORFI ANTIBODIES    EKG None  Radiology Dg Chest 2 View  Result Date: 08/02/2018 CLINICAL DATA:  Weakness for 1 and half weeks. EXAM: CHEST - 2 VIEW COMPARISON:  None. FINDINGS: Mild cardiomegaly. Hila and mediastinum are normal. Minimal atelectasis in the left base. No other abnormalities in the lungs. IMPRESSION: No active cardiopulmonary disease. Electronically Signed   By: Gerome Sam III M.D   On: 08/02/2018 20:11   Ct Head Wo Contrast  Result Date: 08/02/2018 CLINICAL DATA:  67 year old male with history of right-sided weakness for the past few days. EXAM: CT HEAD WITHOUT CONTRAST TECHNIQUE: Contiguous axial images were obtained from the base of the  skull through the vertex without intravenous contrast. COMPARISON:  No priors. FINDINGS: Brain: No evidence of acute infarction, hemorrhage, hydrocephalus, extra-axial collection or mass lesion/mass effect. Vascular: No hyperdense vessel or unexpected calcification. Skull: Normal. Negative for fracture or focal lesion. Sinuses/Orbits: No acute finding. Other: None. IMPRESSION: 1. No acute intracranial abnormalities. The appearance of the brain is normal. Electronically Signed   By: Trudie Reed M.D.   On: 08/02/2018 20:16    Procedures Procedures (including critical care time)  Medications Ordered in ED Medications  sodium chloride 0.9 % bolus 1,000 mL (1,000 mLs Intravenous New Bag/Given 08/02/18 2014)  lidocaine-EPINEPHrine (XYLOCAINE W/EPI) 2 %-1:200000 (PF) injection 20 mL (has no administration in time range)  doxycycline (VIBRAMYCIN) 200 mg in dextrose 5 % 250 mL IVPB (has no administration in time range)  acetaminophen (TYLENOL) tablet 650 mg (650 mg Oral Given 08/02/18 1854)    CRITICAL CARE Performed by: Bethann Berkshire Total critical care time:71minutes Critical care time was exclusive of separately billable procedures and treating other patients. Critical care was necessary to treat or prevent imminent or life-threatening deterioration. Critical care was time spent personally by me on the following activities: development of treatment plan with patient and/or surrogate as well as nursing, discussions with consultants, evaluation of patient's response to treatment, examination of patient, obtaining history from patient or surrogate, ordering and performing treatments and interventions, ordering and review of laboratory studies, ordering and review of radiographic studies, pulse oximetry and re-evaluation of patient's condition.  Initial Impression / Assessment and Plan / ED Course  I have reviewed the triage vital signs and the nursing notes.  Pertinent labs & imaging results that  were available during my care of the patient were reviewed by me and considered in my medical decision making (see chart for details). Patient with fever weakness and neurological symptoms.  Labs unremarkable.  I spoke with infectious disease and the patient will have serum tests for tickborne diseases.  He will have an LP done and Lyme titers done on his CSF.  Patient will be treated with doxycycline initially 200 mg and then 100 mg twice a day.  He will be admitted to medicine     Final Clinical Impressions(s) / ED Diagnoses   Final diagnoses:  None    ED Discharge Orders    None  Bethann Berkshire, MD 08/02/18 2105

## 2018-08-02 NOTE — ED Provider Notes (Signed)
I did not have any part of decision making or management of the patient. I performed the lumbar puncture only as documented below.   Physical Exam  BP (!) 156/83 (BP Location: Left Arm)   Pulse (!) 101   Temp (!) 104.1 F (40.1 C) (Rectal)   Resp 20   Ht 6\' 1"  (1.854 m)   Wt 88.5 kg (195 lb)   SpO2 100%   BMI 25.73 kg/m   Physical Exam  Skin:  No e/o cellulitis over back    ED Course/Procedures     .Lumbar Puncture Date/Time: 08/02/2018 11:03 PM Performed by: Marily MemosMesner, Wauneta Silveria, MD Authorized by: Marily MemosMesner, Ankit Degregorio, MD   Consent:    Consent obtained:  Verbal   Consent given by:  Patient   Risks discussed:  Bleeding, infection, nerve damage, repeat procedure, pain and headache   Alternatives discussed:  No treatment and delayed treatment Pre-procedure details:    Procedure purpose:  Diagnostic   Preparation: Patient was prepped and draped in usual sterile fashion   Anesthesia (see MAR for exact dosages):    Anesthesia method:  Local infiltration   Local anesthetic:  Lidocaine 2% WITH epi Procedure details:    Lumbar space:  L3-L4 interspace   Patient position:  R lateral decubitus   Needle gauge:  22   Needle type:  Spinal needle - Quincke tip   Needle length (in):  3.5   Ultrasound guidance: no     Number of attempts:  1   Opening pressure (cm H2O):  20   Closing pressure (cm H2O): did not check.   Fluid appearance:  Clear   Tubes of fluid:  4   Total volume (ml):  6 Post-procedure:    Puncture site:  Adhesive bandage applied and direct pressure applied   Patient tolerance of procedure:  Tolerated well, no immediate complications     Marily MemosMesner, Tyr Franca, MD 08/02/18 2304

## 2018-08-02 NOTE — ED Triage Notes (Signed)
Pt reports he has developed a R hand tremor and increased lethargy x1 week. Had removed a tick approx 10 days ago. Was seen by PCP today and had blood work done but did not get any answers. Pt is alert and oriented X4, ambulatory to room with no gait difficulties.

## 2018-08-02 NOTE — H&P (Signed)
History and Physical    Harry Bowen ZOX:[id number] DOB: [birth year] DOA: 08/02/2018  PCP: Estanislado Pandy, MD   Patient coming from: Home.  I have personally briefly reviewed patient's old medical records in [facility]  Chief Complaint: Weakness.  HPI: Harry Bowen is a 67 y.o. male with medical history significant of CAD history of anterior STEMI, S/P PCI to LAD, chronic diastolic CHF, hypertension, chronic renal disease who is coming to the emergency department with complaints of right hand tremor and increased lethargy for the past 3 to 4 days.  He noticed that he had a tick bite in his abdomen about 2 weeks ago.  He denies fever, chills, night sweats, sore throat, dyspnea, hemoptysis, wheezing, chest pain, palpitations, dizziness, diaphoresis, PND, orthopnea or pitting edema of the lower extremities.  No nausea, emesis, abdominal pain, diarrhea, constipation, melena or hematochezia.  Denies dysuria, frequency or hematuria.  No heat or cold intolerance.  No polyphagia, polydipsia or polyuria.  ED Course: Initial vital signs temperature 103.1 F, pulse 101, respirations 20, blood pressure 156/83 mmHg and O2 sat 100% on room air.  The patient received a 1000 NS bolus, 200 mg of IV doxycycline and acetaminophen in the emergency department.  ID on call was consulted by Dr. Estell Harpin.  They recommended to perform LP and Borrelia burgdorferi serologies.  His CBC was normal.  Lactic acid was 0.8, sodium 131, potassium 4.0, chloride 96 and CO2 27 mmol/L.  BUN 15, creatinine 1.31, glucose 120 and calcium 8.5 g/dL.  LFTs show a mildly increasing direct bilirubin of 1.0 mg/dL, but all other values are normal.  Urinalysis showed moderate hemoglobinuria and minimal ketonuria.  All other values were normal.    His chest radiograph did not show any acute cardiopulmonary disease.  CT head did not show any acute intracranial abnormality.  Review of Systems: As per HPI otherwise 10 point review of systems  negative.   Past Medical History:  Diagnosis Date  . CAD (coronary artery disease)    a. 04/2015 Ant STEMI/PCI:  LM nl, LAD 100p w/ R->L Collats (3.5x23 Xience DES), LCX nl, RCA nl, EF 45%.  . Diabetes mellitus without complication (HCC)   . Hypertension   . Renal disorder     Past Surgical History:  Procedure Laterality Date  . CARDIAC CATHETERIZATION N/A 05/08/2015   Procedure: Left Heart Cath and Coronary Angiography;  Surgeon: Lennette Bihari, MD;  Location: [facility] INVASIVE CV LAB;  Service: Cardiovascular;  Laterality: N/A;  . PERCUTANEOUS CORONARY STENT INTERVENTION (PCI-S)  05/08/2015   Procedure: Percutaneous Coronary Stent Intervention (Pci-S);  Surgeon: Lennette Bihari, MD;  Location: [facility] INVASIVE CV LAB;  Service: Cardiovascular;;     reports that he has never smoked. He has never used smokeless tobacco. He reports that he does not drink alcohol or use drugs.  Allergies  Allergen Reactions  . Lipitor [Atorvastatin] Other (See Comments)    Muscle aches- tolerates Zocor    Family History  Problem Relation Age of Onset  . Other Father        MVA caused death  . Heart attack Paternal Grandfather     Prior to Admission medications   Medication Sig Start Date End Date Taking? Authorizing Provider  lisinopril (PRINIVIL,ZESTRIL) 10 MG tablet Take 10 mg by mouth every morning.  07/21/18  Yes [provider]  metFORMIN (GLUCOPHAGE-XR) 500 MG 24 hr tablet Take 500 mg by mouth at bedtime.    Yes [provider]  metoprolol succinate (TOPROL-XL) 25 MG 24 hr tablet TAKE ONE TABLET BY MOUTH EVERY DAY Patient taking differently: TAKE ONE TABLET BY MOUTH EVERY DAY IN THE EVENING 04/13/18  Yes Laqueta LindenKoneswaran, Suresh A, MD  nitroGLYCERIN (NITROSTAT) 0.4 MG SL tablet Place 1 tablet (0.4 mg total) under the tongue every 5 (five) minutes as needed for chest pain. 09/08/16  Yes Laqueta LindenKoneswaran, Suresh A, MD  simvastatin (ZOCOR) 40 MG tablet TAKE 1 TABLET BY MOUTH DAILY AT 6 PM. 05/10/18  Yes  Jonelle SidleMcDowell, Samuel G, MD    Physical Exam: Vitals:   08/02/18 1852 08/02/18 2013 08/02/18 2136 08/02/18 2200  BP:   128/72 131/70  Pulse:   71 69  Resp:   (!) 21 18  Temp: (!) 104.1 F (40.1 C) 98.8 F (37.1 C)    TempSrc: Rectal Oral    SpO2:   97% 99%  Weight:      Height:        Constitutional: NAD, calm, comfortable Eyes: PERRL, lids and conjunctivae normal ENMT: Mucous membranes are moist. Posterior pharynx clear of any exudate or lesions. Neck: normal, supple, no masses, no thyromegaly Respiratory: clear to auscultation bilaterally, no wheezing, no crackles. Normal respiratory effort. No accessory muscle use.  Cardiovascular: Regular rate and rhythm, no murmurs / rubs / gallops. No extremity edema. 2+ pedal pulses. No carotid bruits.  Abdomen: Soft, no tenderness, no masses palpated. No hepatosplenomegaly. Bowel sounds positive.  Musculoskeletal: no clubbing / cyanosis. Good ROM, no contractures. Normal muscle tone.  Skin: Very minimal rash on RLQ area (per patient, he had a rash in the area for several days). Neurologic: Basal tremor on both hands.  CN 2-12 grossly intact. Sensation intact, DTR normal.  Has some difficulty with nose to finger.  Mild generalized weakness..  Gait evaluation was deferred, since the patient had she has had a lumbar puncture. Psychiatric: Normal judgment and insight. Alert and oriented x 4.. Normal mood.    Labs on Admission: I have personally reviewed following labs and imaging studies  CBC: Recent Labs  Lab 08/02/18 1850  WBC 4.9  NEUTROABS 3.4  HGB 13.0  HCT 39.4  MCV 91.0  PLT 185   Basic Metabolic Panel: Recent Labs  Lab 08/02/18 1850  NA 131*  K 4.0  CL 96*  CO2 27  GLUCOSE 120*  BUN 15  CREATININE 1.31*  CALCIUM 8.5*   GFR: Estimated Creatinine Clearance: 60.1 mL/min (A) (by C-G formula based on SCr of 1.31 mg/dL (H)). Liver Function Tests: Recent Labs  Lab 08/02/18 1850  AST 30  ALT 22  ALKPHOS 67  BILITOT 1.2   PROT 7.6  ALBUMIN 4.1   No results for input(s): LIPASE, AMYLASE in the last 168 hours. No results for input(s): AMMONIA in the last 168 hours. Coagulation Profile: No results for input(s): INR, PROTIME in the last 168 hours. Cardiac Enzymes: No results for input(s): CKTOTAL, CKMB, CKMBINDEX, TROPONINI in the last 168 hours. BNP (last 3 results) No results for input(s): PROBNP in the last 8760 hours. HbA1C: No results for input(s): HGBA1C in the last 72 hours. CBG: Recent Labs  Lab 08/02/18 1908  GLUCAP 104*   Lipid Profile: No results for input(s): CHOL, HDL, LDLCALC, TRIG, CHOLHDL, LDLDIRECT in the last 72 hours. Thyroid Function Tests: No results for input(s): TSH, T4TOTAL, FREET4, T3FREE, THYROIDAB in the last 72 hours. Anemia Panel: No results for input(s): VITAMINB12, FOLATE, FERRITIN, TIBC, IRON, RETICCTPCT in the last 72 hours. Urine analysis:    Component  Value Date/Time   COLORURINE YELLOW 08/02/2018 1823   APPEARANCEUR CLEAR 08/02/2018 1823   LABSPEC 1.021 08/02/2018 1823   PHURINE 5.0 08/02/2018 1823   GLUCOSEU NEGATIVE 08/02/2018 1823   HGBUR MODERATE (A) 08/02/2018 1823   BILIRUBINUR NEGATIVE 08/02/2018 1823   KETONESUR 5 (A) 08/02/2018 1823   PROTEINUR NEGATIVE 08/02/2018 1823   NITRITE NEGATIVE 08/02/2018 1823   LEUKOCYTESUR NEGATIVE 08/02/2018 1823    Radiological Exams on Admission: Dg Chest 2 View  Result Date: 08/02/2018 CLINICAL DATA:  Weakness for 1 and half weeks. EXAM: CHEST - 2 VIEW COMPARISON:  None. FINDINGS: Mild cardiomegaly. Hila and mediastinum are normal. Minimal atelectasis in the left base. No other abnormalities in the lungs. IMPRESSION: No active cardiopulmonary disease. Electronically Signed   By: Gerome Sam III M.D   On: 08/02/2018 20:11   Ct Head Wo Contrast  Result Date: 08/02/2018 CLINICAL DATA:  67 year old male with history of right-sided weakness for the past few days. EXAM: CT HEAD WITHOUT CONTRAST TECHNIQUE: Contiguous  axial images were obtained from the base of the skull through the vertex without intravenous contrast. COMPARISON:  No priors. FINDINGS: Brain: No evidence of acute infarction, hemorrhage, hydrocephalus, extra-axial collection or mass lesion/mass effect. Vascular: No hyperdense vessel or unexpected calcification. Skull: Normal. Negative for fracture or focal lesion. Sinuses/Orbits: No acute finding. Other: None. IMPRESSION: 1. No acute intracranial abnormalities. The appearance of the brain is normal. Electronically Signed   By: Trudie Reed M.D.   On: 08/02/2018 20:16   05/08/2015 left heart cath and coronary angiography   Prox LAD lesion, 100% stenosed. There is a 0% residual stenosis post intervention.  A drug-eluting stent was placed.   Out of hospital anterior ST segment elevation myocardial infarction which probably occurred greater than 12 hours from presentation to [facility] emergency room with postinfarct angina.  Single vessel coronary obstructive disease with total proximal occlusion of the LAD beyond the proximal first septal perforating artery with initial TIMI 0 flow, and once reperfusion established with balloon dilatation significant vasospasm of the mid LAD which responded to intracoronary nitroglycerin.  Normal left circumflex coronary artery and normal large dominant right coronary artery with right to left collateralization from the distal RCA to the LAD extending to the point of proximal occlusion.  Acute LV dysfunction with an ejection fraction of approximate 45% with moderate to severe hypocontractility involving the mid distal anterolateral wall apex and distal inferior apical segment.  Successful percutaneous coronary intervention with insertion of a 3.523 mm Xience Alpine DES stent postdilated to 3.75 mm with the proximal 100% occlusion being reduced to 0%.  RECOMMENDATION:  The patient should continue with dual antiplatelet therapy for minimum of a year and  based on recent Pegasus trial data consideration for  long-term therapy with reduced dose Brilinta at 60 mg twice a day after one year of 90 mg twice a day.   EKG: Independently reviewed.  Vent. rate 90 BPM PR interval * ms QRS duration 137 ms QT/QTc 376/461 ms P-R-T axes -6 193 23 Sinus rhythm Right bundle branch block Minimal ST elevation, lateral leads  Assessment/Plan Principal Problem:   Ataxia Positive history of fever and tick bite. Admit to telemetry. Gentle IV fluids. Continue doxycycline per ID recommendation. Follow Borrelia burgdorferi titers and antigen results. Follow-up CSF analysis results. Consider MR of brain if he worsens. Consider neurology evaluation if symptoms do not resolve.  Active Problems:   Essential hypertension Continue lisinopril 10 mg p.o. daily. Continue metoprolol 25 mg  p.o. daily. Monitor blood pressure, heart rate, renal function electrolytes.    CAD (coronary artery disease) Continue aspirin, metoprolol, lisinopril and simvastatin.    Dyslipidemia Continue simvastatin 40 mg p.o. daily. Follow-up LFTs as needed. Fasting lipid profile checks per PCP.    Type 2 diabetes mellitus (HCC) Last hemoglobin A1c on record is 7.3% on 05/08/2015. Continue metformin 500 mg p.o. at bedtime. CBG monitoring before meals and bedtime..    DVT prophylaxis: SCDs. Code Status: Full code. Family Communication:  Disposition Plan: Observation for fever and ataxia pending results of CSF analysis.  Consults called:  Admission status: Observation/telemetry.   Bobette Mo MD [facility] Pager [phone].  If 7PM-7AM, please contact night-coverage [email or web address] Password [email or web address]  08/02/2018, 10:26 PM

## 2018-08-02 NOTE — ED Notes (Signed)
Pt aware of urine sample needed.

## 2018-08-03 DIAGNOSIS — R270 Ataxia, unspecified: Secondary | ICD-10-CM | POA: Diagnosis not present

## 2018-08-03 LAB — COMPREHENSIVE METABOLIC PANEL
ALBUMIN: 3.5 g/dL (ref 3.5–5.0)
ALK PHOS: 58 U/L (ref 38–126)
ALT: 21 U/L (ref 0–44)
AST: 28 U/L (ref 15–41)
Anion gap: 5 (ref 5–15)
BILIRUBIN TOTAL: 1.3 mg/dL — AB (ref 0.3–1.2)
BUN: 15 mg/dL (ref 8–23)
CALCIUM: 8 mg/dL — AB (ref 8.9–10.3)
CO2: 26 mmol/L (ref 22–32)
Chloride: 104 mmol/L (ref 98–111)
Creatinine, Ser: 1.19 mg/dL (ref 0.61–1.24)
GFR calc Af Amer: >60 mL/min (ref >60)
GFR calc non Af Amer: >60 mL/min (ref >60)
GLUCOSE: 113 mg/dL — AB (ref 70–99)
Potassium: 3.9 mmol/L (ref 3.5–5.1)
Sodium: 135 mmol/L (ref 135–145)
TOTAL PROTEIN: 6.5 g/dL (ref 6.5–8.1)

## 2018-08-03 LAB — GLUCOSE, CAPILLARY
GLUCOSE-CAPILLARY: 113 mg/dL — AB (ref 70–99)
GLUCOSE-CAPILLARY: 133 mg/dL — AB (ref 70–99)
GLUCOSE-CAPILLARY: 89 mg/dL (ref 70–99)
Glucose-Capillary: 136 mg/dL — ABNORMAL HIGH (ref 70–99)

## 2018-08-03 LAB — CULTURE, BLOOD (ROUTINE X 2)
CULTURE: NO GROWTH
Special Requests: ADEQUATE

## 2018-08-03 LAB — CBC WITH DIFFERENTIAL/PLATELET
Basophils Absolute: 0 10*3/uL (ref 0.0–0.1)
Basophils Relative: 0 %
Eosinophils Absolute: 0 10*3/uL (ref 0.0–0.7)
Eosinophils Relative: 0 %
HEMATOCRIT: 36.9 % — AB (ref 39.0–52.0)
HEMOGLOBIN: 12 g/dL — AB (ref 13.0–17.0)
LYMPHS ABS: 0.9 10*3/uL (ref 0.7–4.0)
LYMPHS PCT: 21 %
MCH: 29.6 pg (ref 26.0–34.0)
MCHC: 32.5 g/dL (ref 30.0–36.0)
MCV: 90.9 fL (ref 78.0–100.0)
Monocytes Absolute: 0.4 10*3/uL (ref 0.1–1.0)
Monocytes Relative: 10 %
NEUTROS ABS: 2.8 10*3/uL (ref 1.7–7.7)
NEUTROS PCT: 69 %
Platelets: 153 10*3/uL (ref 150–400)
RBC: 4.06 MIL/uL — AB (ref 4.22–5.81)
RDW: 12.5 % (ref 11.5–15.5)
WBC: 4.1 10*3/uL (ref 4.0–10.5)

## 2018-08-03 LAB — MRSA PCR SCREENING: MRSA by PCR: NEGATIVE

## 2018-08-03 MED ORDER — KETOROLAC TROMETHAMINE 30 MG/ML IJ SOLN
30.0000 mg | Freq: Four times a day (QID) | INTRAMUSCULAR | Status: DC | PRN
  Administered 2018-08-03: 30 mg via INTRAVENOUS
  Filled 2018-08-03: qty 1

## 2018-08-03 MED ORDER — ACETAMINOPHEN 325 MG PO TABS
650.0000 mg | ORAL_TABLET | Freq: Four times a day (QID) | ORAL | Status: DC | PRN
  Administered 2018-08-03: 650 mg via ORAL
  Filled 2018-08-03: qty 2

## 2018-08-03 NOTE — Progress Notes (Signed)
Patient report generalized pain described as severe. -PRN medication to be administered

## 2018-08-03 NOTE — Progress Notes (Signed)
PROGRESS NOTE    Harry Bowen  ZOX:[id number] DOB: 09-23-1951 DOA: 08/02/2018 PCP: Estanislado Pandy, MD   Brief Narrative:   Harry Bowen is a 67 y.o. male with medical history significant of CAD history of anterior STEMI, S/P PCI to LAD, chronic diastolic CHF, hypertension, chronic renal disease who is coming to the emergency department with complaints of right hand tremor and increased lethargy for the past 3 to 4 days.    He has also had some gait ataxia.  He noticed that he had a tick bite in his abdomen about 2 weeks ago and has been admitted for suspected tickborne illness and started on IV doxycycline.  LP has been performed with studies pending as well as serologies pending.  He continues to have some low-grade fevers.  Assessment & Plan:   Principal Problem:   Ataxia Active Problems:   Essential hypertension   CAD (coronary artery disease)   Dyslipidemia   Type 2 diabetes mellitus (HCC)   1. Lethargy and neurological sequelae suspected to be secondary to tickborne illness.  Studies are currently pending with serologies as well as CSF analysis.  Continue doxycycline per ID recommendation empirically and consider further analysis with brain MRI as well as neurology consultation should patient not continue to improve.  I will order PT evaluation today. 2. Essential hypertension.  Currently well controlled and will maintain on lisinopril and metoprolol as ordered. 3. CAD.  Continue aspirin, metoprolol, lisinopril, and simvastatin.  No complaints of chest pain noted. 4. Dyslipidemia.  Continue simvastatin. 5. Type 2 diabetes-good control.  Continue metformin at bedtime with CBG monitoring ongoing.   DVT prophylaxis:SCDs Code Status: Full Family Communication: None at bedside Disposition Plan: Continue empiric treatment with IV doxycycline and follow results of CSF analysis as well as antibody serologies.  PT evaluation ordered today.   Consultants:   ID consulted in ED    Procedures:   None  Antimicrobials:   Doxycycline 8/5->   Subjective: Patient seen and evaluated today with no new acute complaints or concerns. No acute concerns or events noted overnight.  He continues to have some low-grade fevers but otherwise appears to be doing well.  He continues to have some tremors in his hands as well.  Objective: Vitals:   08/03/18 0300 08/03/18 0400 08/03/18 0500 08/03/18 0600  BP: 114/61 112/76 139/81 138/79  Pulse: 89 90 90 88  Resp: (!) 25 (!) 21 (!) 21 (!) 25  Temp:  (!) 101 F (38.3 C)    TempSrc:  Oral    SpO2: 96% 97% 96% 97%  Weight:   87.5 kg (192 lb 14.4 oz)   Height:        Intake/Output Summary (Last 24 hours) at 08/03/2018 0747 Last data filed at 08/03/2018 0500 Gross per 24 hour  Intake 1000.4 ml  Output 500 ml  Net 500.4 ml   Filed Weights   08/02/18 1821 08/02/18 2300 08/03/18 0500  Weight: 88.5 kg (195 lb) 87.5 kg (192 lb 14.4 oz) 87.5 kg (192 lb 14.4 oz)    Examination:  General exam: Appears calm and comfortable  Respiratory system: Clear to auscultation. Respiratory effort normal. Cardiovascular system: S1 & S2 heard, RRR. No JVD, murmurs, rubs, gallops or clicks. No pedal edema. Gastrointestinal system: Abdomen is nondistended, soft and nontender. No organomegaly or masses felt. Normal bowel sounds heard. Central nervous system: Alert and oriented. No focal neurological deficits of sensory or motor on my brief exam. Extremities: Symmetric 5 x 5  power. Skin: No rashes, lesions or ulcers Psychiatry: Judgement and insight appear normal. Mood & affect appropriate.     Data Reviewed: I have personally reviewed following labs and imaging studies  CBC: Recent Labs  Lab 08/02/18 1850 08/03/18 0415  WBC 4.9 4.1  NEUTROABS 3.4 2.8  HGB 13.0 12.0*  HCT 39.4 36.9*  MCV 91.0 90.9  PLT 185 153   Basic Metabolic Panel: Recent Labs  Lab 08/02/18 1850 08/03/18 0415  NA 131* 135  K 4.0 3.9  CL 96* 104  CO2 27 26   GLUCOSE 120* 113*  BUN 15 15  CREATININE 1.31* 1.19  CALCIUM 8.5* 8.0*   GFR: Estimated Creatinine Clearance: 66.2 mL/min (by C-G formula based on SCr of 1.19 mg/dL). Liver Function Tests: Recent Labs  Lab 08/02/18 1850 08/03/18 0415  AST 30 28  ALT 22 21  ALKPHOS 67 58  BILITOT 1.2 1.3*  PROT 7.6 6.5  ALBUMIN 4.1 3.5   No results for input(s): LIPASE, AMYLASE in the last 168 hours. No results for input(s): AMMONIA in the last 168 hours. Coagulation Profile: No results for input(s): INR, PROTIME in the last 168 hours. Cardiac Enzymes: No results for input(s): CKTOTAL, CKMB, CKMBINDEX, TROPONINI in the last 168 hours. BNP (last 3 results) No results for input(s): PROBNP in the last 8760 hours. HbA1C: No results for input(s): HGBA1C in the last 72 hours. CBG: Recent Labs  Lab 08/02/18 1908 08/03/18 0742  GLUCAP 104* 113*   Lipid Profile: No results for input(s): CHOL, HDL, LDLCALC, TRIG, CHOLHDL, LDLDIRECT in the last 72 hours. Thyroid Function Tests: No results for input(s): TSH, T4TOTAL, FREET4, T3FREE, THYROIDAB in the last 72 hours. Anemia Panel: No results for input(s): VITAMINB12, FOLATE, FERRITIN, TIBC, IRON, RETICCTPCT in the last 72 hours. Sepsis Labs: Recent Labs  Lab 08/02/18 1912  LATICACIDVEN 0.8    Recent Results (from the past 240 hour(s))  Blood culture (routine x 2)     Status: None (Preliminary result)   Collection Time: 08/02/18  7:12 PM  Result Value Ref Range Status   Specimen Description BLOOD RIGHT ARM  Final   Special Requests   Final    BOTTLES DRAWN AEROBIC AND ANAEROBIC Blood Culture adequate volume Performed at [facility], [address] [place], [place] [address]    Culture PENDING  Incomplete   Report Status PENDING  Incomplete  Blood culture (routine x 2)     Status: None (Preliminary result)   Collection Time: 08/02/18  7:14 PM  Result Value Ref Range Status   Specimen Description BLOOD RIGHT ARM  Final   Special  Requests   Final    BOTTLES DRAWN AEROBIC AND ANAEROBIC Blood Culture adequate volume Performed at [facility], [address] [place], [place] [address]    Culture PENDING  Incomplete   Report Status PENDING  Incomplete  CSF culture with Stat gram stain     Status: None (Preliminary result)   Collection Time: 08/02/18  9:09 PM  Result Value Ref Range Status   Specimen Description CSF  Final   Special Requests Normal  Final   Gram Stain   Final    CYTOSPIN SMEAR NO ORGANISMS SEEN NO WBC SEEN Performed at [facility] Performed at [facility], [address] [place], [place] [address]    Culture PENDING  Incomplete   Report Status PENDING  Incomplete  MRSA PCR Screening     Status: None   Collection Time: 08/02/18 11:11 PM  Result  Value Ref Range Status   MRSA by PCR NEGATIVE NEGATIVE Final    Comment:        The GeneXpert MRSA Assay (FDA approved for NASAL specimens only), is one component of a comprehensive MRSA colonization surveillance program. It is not intended to diagnose MRSA infection nor to guide or monitor treatment for MRSA infections. Performed at [facility], [address] [place], [place] [address]          Radiology Studies: Dg Chest 2 View  Result Date: 08/02/2018 CLINICAL DATA:  Weakness for 1 and half weeks. EXAM: CHEST - 2 VIEW COMPARISON:  None. FINDINGS: Mild cardiomegaly. Hila and mediastinum are normal. Minimal atelectasis in the left base. No other abnormalities in the lungs. IMPRESSION: No active cardiopulmonary disease. Electronically Signed   By: Gerome Sam III M.D   On: 08/02/2018 20:11   Ct Head Wo Contrast  Result Date: 08/02/2018 CLINICAL DATA:  67 year old male with history of right-sided weakness for the past few days. EXAM: CT HEAD WITHOUT CONTRAST TECHNIQUE: Contiguous axial images were obtained from the base of the skull through the vertex without intravenous contrast. COMPARISON:  No priors. FINDINGS: Brain: No  evidence of acute infarction, hemorrhage, hydrocephalus, extra-axial collection or mass lesion/mass effect. Vascular: No hyperdense vessel or unexpected calcification. Skull: Normal. Negative for fracture or focal lesion. Sinuses/Orbits: No acute finding. Other: None. IMPRESSION: 1. No acute intracranial abnormalities. The appearance of the brain is normal. Electronically Signed   By: Trudie Reed M.D.   On: 08/02/2018 20:16        Scheduled Meds: . lisinopril  10 mg Oral q morning - 10a  . metFORMIN  500 mg Oral QHS  . metoprolol succinate  25 mg Oral QPM  . simvastatin  40 mg Oral q1800   Continuous Infusions: . sodium chloride 100 mL/hr at 08/02/18 2315  . doxycycline (VIBRAMYCIN) IV       LOS: 0 days    Time spent: 30 minutes    Mabelle Mungin Hoover Brunette, DO [facility] Pager [phone]  If 7PM-7AM, please contact night-coverage [email or web address] Password [facility] 08/03/2018, 7:47 AM

## 2018-08-03 NOTE — Care Management Obs Status (Signed)
MEDICARE OBSERVATION STATUS NOTIFICATION   Patient Details  Name: Harry Bowen MRN: [id number] Date of Birth: Jan 13, 1951   Medicare Observation Status Notification Given:  Yes    Othmar Ringer, Chrystine OilerSharley Diane, RN 08/03/2018, 8:38 AM

## 2018-08-03 NOTE — Evaluation (Signed)
Physical Therapy Evaluation Patient Details Name: Harry Bowen MRN: [id number] DOB: 1951-03-12 Today's Date: 08/03/2018   History of Present Illness  Patient is a 67 year old male admitted 08/02/2018 with c/o muscle weakness and right hand tremor. PMH: HTN, CAD, dyslipidemia, DM2, herniated disc lumbar spine.     Clinical Impression  Patient evaluated by Physical Therapy with no further acute PT needs identified. All education has been completed and the patient has no further questions. Patient does exhibit drift left/right when conversing with therapists during ambulation activities. Independent with bed mobility and supervision to min guard for transfers and ambulation without assistive devices. Patient agreeable SPC use to improve steadiness on feet until he is able to regain PLOF and balance.  See below for any follow-up Physical Therapy or equipment needs. PT is signing off. Thank you for this referral.     Follow Up Recommendations No PT follow up    Equipment Recommendations  Cane    Recommendations for Other Services       Precautions / Restrictions Precautions Precautions: Fall Restrictions Weight Bearing Restrictions: No      Mobility  Bed Mobility Overal bed mobility: Independent                Transfers Overall transfer level: Needs assistance Equipment used: None Transfers: Sit to/from Stand;Stand Pivot Transfers Sit to Stand: Supervision Stand pivot transfers: Supervision          Ambulation/Gait Ambulation/Gait assistance: Min guard Gait Distance (Feet): 300 Feet Assistive device: None Gait Pattern/deviations: Step-through pattern;Decreased stride length;Drifts right/left Gait velocity: decr   General Gait Details: drifts left/right when turning head to converse with therapist while ambulating in hallway  Stairs            Wheelchair Mobility    Modified Rankin (Stroke Patients Only)       Balance Overall balance assessment:  Mild deficits observed, not formally tested                                           Pertinent Vitals/Pain Pain Assessment: No/denies pain    Home Living Family/patient expects to be discharged to:: Private residence Living Arrangements: Spouse/significant other Available Help at Discharge: Family(sister-in-law helps take care of his wife M-F 8am-4pm. Adult son lives upstairs in house.) Type of Home: House Home Access: Stairs to enter Entrance Stairs-Rails: None Entrance Stairs-Number of Steps: 4 + plus in foyer. Home Layout: Multi-level;Able to live on main level with bedroom/bathroom Home Equipment: Bedside commode;Walker - 2 wheels;Wheelchair - manual Additional Comments: wife no longer ambulatory    Prior Function Level of Independence: Independent               Hand Dominance        Extremity/Trunk Assessment   Upper Extremity Assessment Upper Extremity Assessment: Overall WFL for tasks assessed    Lower Extremity Assessment Lower Extremity Assessment: Overall WFL for tasks assessed    Cervical / Trunk Assessment Cervical / Trunk Assessment: Normal  Communication   Communication: No difficulties  Cognition Arousal/Alertness: Awake/alert Behavior During Therapy: WFL for tasks assessed/performed Overall Cognitive Status: Within Functional Limits for tasks assessed                                        General Comments  Exercises     Assessment/Plan    PT Assessment Patent does not need any further PT services  PT Problem List         PT Treatment Interventions      PT Goals (Current goals can be found in the Care Plan section)  Acute Rehab PT Goals Patient Stated Goal: go home and go back to work PT Goal Formulation: With patient Time For Goal Achievement: 08/17/18 Potential to Achieve Goals: Good    Frequency     Barriers to discharge        Co-evaluation               AM-PAC PT "6  Clicks" Daily Activity  Outcome Measure Difficulty turning over in bed (including adjusting bedclothes, sheets and blankets)?: None Difficulty moving from lying on back to sitting on the side of the bed? : None Difficulty sitting down on and standing up from a chair with arms (e.g., wheelchair, bedside commode, etc,.)?: A Little Help needed moving to and from a bed to chair (including a wheelchair)?: A Little Help needed walking in hospital room?: A Little Help needed climbing 3-5 steps with a railing? : A Little 6 Click Score: 20    End of Session Equipment Utilized During Treatment: Gait belt Activity Tolerance: Patient tolerated treatment well Patient left: in chair;with call bell/phone within reach Nurse Communication: Mobility status PT Visit Diagnosis: Unsteadiness on feet (R26.81)    Time: [id number] PT Time Calculation (min) (ACUTE ONLY): 28 min   Charges:   PT Evaluation $PT Eval Low Complexity: 1 Low PT Treatments $Gait Training: 8-22 mins        Katina DungBarbara D. Hartnett-Rands, MS, PT [facility] [phone] 08/03/2018, 9:38 AM

## 2018-08-04 DIAGNOSIS — I10 Essential (primary) hypertension: Secondary | ICD-10-CM

## 2018-08-04 DIAGNOSIS — R270 Ataxia, unspecified: Principal | ICD-10-CM

## 2018-08-04 DIAGNOSIS — I2510 Atherosclerotic heart disease of native coronary artery without angina pectoris: Secondary | ICD-10-CM

## 2018-08-04 DIAGNOSIS — E785 Hyperlipidemia, unspecified: Secondary | ICD-10-CM | POA: Diagnosis not present

## 2018-08-04 DIAGNOSIS — Z9189 Other specified personal risk factors, not elsewhere classified: Secondary | ICD-10-CM

## 2018-08-04 DIAGNOSIS — A938 Other specified arthropod-borne viral fevers: Secondary | ICD-10-CM

## 2018-08-04 DIAGNOSIS — E119 Type 2 diabetes mellitus without complications: Secondary | ICD-10-CM

## 2018-08-04 LAB — BASIC METABOLIC PANEL
Anion gap: 4 — ABNORMAL LOW (ref 5–15)
BUN: 16 mg/dL (ref 8–23)
CHLORIDE: 105 mmol/L (ref 98–111)
CO2: 27 mmol/L (ref 22–32)
CREATININE: 1.1 mg/dL (ref 0.61–1.24)
Calcium: 7.6 mg/dL — ABNORMAL LOW (ref 8.9–10.3)
GFR calc Af Amer: >60 mL/min (ref >60)
GFR calc non Af Amer: >60 mL/min (ref >60)
GLUCOSE: 121 mg/dL — AB (ref 70–99)
POTASSIUM: 3.5 mmol/L (ref 3.5–5.1)
SODIUM: 136 mmol/L (ref 135–145)

## 2018-08-04 LAB — B. BURGDORFI ANTIBODIES: B burgdorferi Ab IgG+IgM: <0.91 {ISR} (ref 0.00–0.90)

## 2018-08-04 LAB — CBC
HEMATOCRIT: 34.7 % — AB (ref 39.0–52.0)
HEMOGLOBIN: 11.3 g/dL — AB (ref 13.0–17.0)
MCH: 29.7 pg (ref 26.0–34.0)
MCHC: 32.6 g/dL (ref 30.0–36.0)
MCV: 91.1 fL (ref 78.0–100.0)
Platelets: 126 10*3/uL — ABNORMAL LOW (ref 150–400)
RBC: 3.81 MIL/uL — AB (ref 4.22–5.81)
RDW: 12.6 % (ref 11.5–15.5)
WBC: 3 10*3/uL — ABNORMAL LOW (ref 4.0–10.5)

## 2018-08-04 LAB — GLUCOSE, CAPILLARY
GLUCOSE-CAPILLARY: 139 mg/dL — AB (ref 70–99)
Glucose-Capillary: 121 mg/dL — ABNORMAL HIGH (ref 70–99)
Glucose-Capillary: 143 mg/dL — ABNORMAL HIGH (ref 70–99)
Glucose-Capillary: 178 mg/dL — ABNORMAL HIGH (ref 70–99)

## 2018-08-04 LAB — EHRLICHIA ANTIBODY PANEL
E CHAFFEENSIS AB, IGG: NEGATIVE
E chaffeensis (HGE) Ab, IgM: NEGATIVE
E. Chaffeensis (HME) IgM Titer: NEGATIVE
E.Chaffeensis (HME) IgG: NEGATIVE

## 2018-08-04 LAB — HIV ANTIBODY (ROUTINE TESTING W REFLEX): HIV SCREEN 4TH GENERATION: NONREACTIVE

## 2018-08-04 MED ORDER — DOXYCYCLINE HYCLATE 100 MG PO TABS
100.0000 mg | ORAL_TABLET | Freq: Two times a day (BID) | ORAL | Status: DC
  Administered 2018-08-04 – 2018-08-05 (×2): 100 mg via ORAL
  Filled 2018-08-04 (×2): qty 1

## 2018-08-04 MED ORDER — ZOLPIDEM TARTRATE 5 MG PO TABS
5.0000 mg | ORAL_TABLET | Freq: Every evening | ORAL | Status: DC | PRN
  Administered 2018-08-04: 5 mg via ORAL
  Filled 2018-08-04: qty 1

## 2018-08-04 NOTE — Progress Notes (Signed)
PROGRESS NOTE    Harry Bowen  WRU:[id number] DOB: [birth year] DOA: 08/02/2018 PCP: Estanislado Pandy, MD   Brief Narrative:   Harry Bowen is a 67 y.o. male with medical history significant of CAD history of anterior STEMI, S/P PCI to LAD, chronic diastolic CHF, hypertension, chronic renal disease who is coming to the emergency department with complaints of right hand tremor and increased lethargy for the past 3 to 4 days.    He has also had some gait ataxia.  He noticed that he had a tick bite in his abdomen about 2 weeks ago and has been admitted for suspected tickborne illness and started on IV doxycycline.  LP has been performed with studies pending as well as serologies pending.  He continues to have some low-grade fevers.  Assessment & Plan:   Principal Problem:   Ataxia Active Problems:   Essential hypertension   CAD (coronary artery disease)   Dyslipidemia   Type 2 diabetes mellitus (HCC)   1. Lethargy and neurological sequelae suspected to be secondary to tickborne illness.  Studies are currently pending with serologies; CSF analysis essentially negative.  Continue doxycycline per ID recommendation empirically. Symptoms improving.  2. Essential hypertension.  Currently well controlled and will maintain on lisinopril and metoprolol as ordered. 3. CAD.  Continue aspirin, metoprolol, lisinopril, and simvastatin.  No complaints of chest pain reported.  Patient also denies chest pain or shortness of breath.. 4. Dyslipidemia.  Continue Zocor. 5. Type 2 diabetes-good control.  We will continue metformin and follow CBGs.  If needed will add sliding scale insulin coverage.    DVT prophylaxis:SCDs Code Status: Full Family Communication: None at bedside Disposition Plan: Continue empiric treatment with IV doxycycline and follow results of CSF analysis as well as antibody serologies.  PT evaluation ordered today.   Consultants:   ID consulted in ED   Procedures:    None  Antimicrobials:   Doxycycline 8/5->   Subjective: No acute events or concerns overnight.  Patient denies chest pain, shortness of breath, nausea, vomiting, abdominal pain or any other acute complaints.  He has been now afebrile for almost 24 hours and is reporting significant improvement in his hand tremors.  Objective: Vitals:   08/04/18 0800 08/04/18 1300 08/04/18 1700 08/04/18 1830  BP: 128/77   136/81  Pulse:    80  Resp:    18  Temp:  98.6 F (37 C) 98.4 F (36.9 C)   TempSrc:  Oral Oral   SpO2:    100%  Weight:      Height:        Intake/Output Summary (Last 24 hours) at 08/04/2018 1857 Last data filed at 08/04/2018 0900 Gross per 24 hour  Intake 490 ml  Output 1875 ml  Net -1385 ml   Filed Weights   08/02/18 2300 08/03/18 0500 08/04/18 0500  Weight: 87.5 kg (192 lb 14.4 oz) 87.5 kg (192 lb 14.4 oz) 87.8 kg (193 lb 9 oz)    Examination: General exam: Alert, awake, oriented x 3. Finally afebrile. Also reported no CP, SOB or palpitations.  Respiratory system: Clear to auscultation. Respiratory effort normal. Cardiovascular system:RRR. No murmurs, rubs, gallops. Gastrointestinal system: Abdomen is nondistended, soft and nontender. No organomegaly or masses felt. Normal bowel sounds heard. Central nervous system: Alert and oriented. No focal neurological deficits. Extremities: No C/C/E, +pedal pulses. MS 5/5 bilaterally. No significant tremors appreciated. Skin: No rashes, lesions or ulcers Psychiatry: Judgement and insight appear normal. Mood & affect appropriate.  Data Reviewed: I have personally reviewed following labs and imaging studies  CBC: Recent Labs  Lab 08/02/18 1850 08/03/18 0415 08/04/18 0415  WBC 4.9 4.1 3.0*  NEUTROABS 3.4 2.8  --   HGB 13.0 12.0* 11.3*  HCT 39.4 36.9* 34.7*  MCV 91.0 90.9 91.1  PLT 185 153 126*   Basic Metabolic Panel: Recent Labs  Lab 08/02/18 1850 08/03/18 0415 08/04/18 0415  NA 131* 135 136  K 4.0 3.9  3.5  CL 96* 104 105  CO2 27 26 27   GLUCOSE 120* 113* 121*  BUN 15 15 16   CREATININE 1.31* 1.19 1.10  CALCIUM 8.5* 8.0* 7.6*   GFR: Estimated Creatinine Clearance: 71.6 mL/min (by C-G formula based on SCr of 1.1 mg/dL).   Liver Function Tests: Recent Labs  Lab 08/02/18 1850 08/03/18 0415  AST 30 28  ALT 22 21  ALKPHOS 67 58  BILITOT 1.2 1.3*  PROT 7.6 6.5  ALBUMIN 4.1 3.5   CBG: Recent Labs  Lab 08/03/18 1615 08/03/18 1938 08/04/18 0731 08/04/18 1128 08/04/18 1604  GLUCAP 89 136* 121* 139* 143*   Sepsis Labs: Recent Labs  Lab 08/02/18 1912  LATICACIDVEN 0.8    Recent Results (from the past 240 hour(s))  Blood culture (routine x 2)     Status: None   Collection Time: 08/02/18  7:12 PM  Result Value Ref Range Status   Specimen Description BLOOD RIGHT ARM  Final   Special Requests   Final    BOTTLES DRAWN AEROBIC AND ANAEROBIC Blood Culture adequate volume   Culture   Final    NO GROWTH 84 DAYS Performed at [facility], [address] [place], [place] [address]    Report Status 08/03/2018 FINAL  Final  Blood culture (routine x 2)     Status: None (Preliminary result)   Collection Time: 08/02/18  7:14 PM  Result Value Ref Range Status   Specimen Description BLOOD RIGHT ARM  Final   Special Requests   Final    BOTTLES DRAWN AEROBIC AND ANAEROBIC Blood Culture adequate volume   Culture   Final    NO GROWTH 2 DAYS Performed at [facility], [address] [place], [place] [address]    Report Status PENDING  Incomplete  CSF culture with Stat gram stain     Status: None (Preliminary result)   Collection Time: 08/02/18  9:09 PM  Result Value Ref Range Status   Specimen Description   Final    CSF Performed at [facility], [address] [place], [place] [address]    Special Requests   Final    Normal Performed at [facility], [address] [place], [place] [address]    Gram Stain   Final    CYTOSPIN SMEAR NO ORGANISMS SEEN NO WBC SEEN Performed at  [facility] Performed at [facility], [address] [place], [place] [address]    Culture   Final    NO GROWTH 2 DAYS Performed at [facility], [address] [place], [place] [address]    Report Status PENDING  Incomplete  MRSA PCR Screening     Status: None   Collection Time: 08/02/18 11:11 PM  Result Value Ref Range Status   MRSA by PCR NEGATIVE NEGATIVE Final    Comment:        The GeneXpert MRSA Assay (FDA approved for NASAL specimens only), is one component of a comprehensive MRSA colonization surveillance program. It is not intended to diagnose  MRSA infection nor to guide or monitor treatment for MRSA infections. Performed at [facility], [address] [place], [place] [address]      Radiology Studies: Dg Chest 2 View  Result Date: 08/02/2018 CLINICAL DATA:  Weakness for 1 and half weeks. EXAM: CHEST - 2 VIEW COMPARISON:  None. FINDINGS: Mild cardiomegaly. Hila and mediastinum are normal. Minimal atelectasis in the left base. No other abnormalities in the lungs. IMPRESSION: No active cardiopulmonary disease. Electronically Signed   By: Gerome Sam III M.D   On: 08/02/2018 20:11   Ct Head Wo Contrast  Result Date: 08/02/2018 CLINICAL DATA:  67 year old male with history of right-sided weakness for the past few days. EXAM: CT HEAD WITHOUT CONTRAST TECHNIQUE: Contiguous axial images were obtained from the base of the skull through the vertex without intravenous contrast. COMPARISON:  No priors. FINDINGS: Brain: No evidence of acute infarction, hemorrhage, hydrocephalus, extra-axial collection or mass lesion/mass effect. Vascular: No hyperdense vessel or unexpected calcification. Skull: Normal. Negative for fracture or focal lesion. Sinuses/Orbits: No acute finding. Other: None. IMPRESSION: 1. No acute intracranial abnormalities. The appearance of the brain is normal. Electronically Signed   By: Trudie Reed M.D.   On: 08/02/2018 20:16   Scheduled  Meds: . doxycycline  100 mg Oral Q12H  . lisinopril  10 mg Oral q morning - 10a  . metFORMIN  500 mg Oral QHS  . metoprolol succinate  25 mg Oral QPM  . simvastatin  40 mg Oral q1800   Continuous Infusions: . sodium chloride 100 mL/hr at 08/04/18 0231     LOS: 0 days    Time spent: 30 minutes    Vassie Loll, MD [facility] Pager [phone]  If 7PM-7AM, please contact night-coverage [email or web address] Password [facility] 08/04/2018, 6:57 PM

## 2018-08-04 NOTE — Progress Notes (Signed)
OT Cancellation Note  Patient Details Name: Harry Bowen MRN: [id number] DOB: 10/14/1951   Cancelled Treatment:    Reason Eval/Treat Not Completed: OT screened, no needs identified, will sign off. Pt reports feeling improved this am, was initially having resting hand tremors that resolved with active movement. Pt reports this has gradually improved since admission, no tremors this am while interacting with OT. Pt using bilateral hands for feeding tasks, opening packages and preparing meal without difficulty. No further OT services required at this time. Instructed pt to follow up with MD if continues to have tremors or if tremors return/worsen in the future.   Ezra SitesLeslie Troxler, OTR/L  [phone] 08/04/2018, 8:07 AM

## 2018-08-05 DIAGNOSIS — A938 Other specified arthropod-borne viral fevers: Secondary | ICD-10-CM

## 2018-08-05 DIAGNOSIS — E119 Type 2 diabetes mellitus without complications: Secondary | ICD-10-CM | POA: Diagnosis not present

## 2018-08-05 DIAGNOSIS — E785 Hyperlipidemia, unspecified: Secondary | ICD-10-CM | POA: Diagnosis not present

## 2018-08-05 DIAGNOSIS — R270 Ataxia, unspecified: Secondary | ICD-10-CM | POA: Diagnosis not present

## 2018-08-05 DIAGNOSIS — I10 Essential (primary) hypertension: Secondary | ICD-10-CM | POA: Diagnosis not present

## 2018-08-05 DIAGNOSIS — N179 Acute kidney failure, unspecified: Secondary | ICD-10-CM

## 2018-08-05 LAB — BASIC METABOLIC PANEL
ANION GAP: 6 (ref 5–15)
BUN: 13 mg/dL (ref 8–23)
CO2: 26 mmol/L (ref 22–32)
Calcium: 8.1 mg/dL — ABNORMAL LOW (ref 8.9–10.3)
Chloride: 106 mmol/L (ref 98–111)
Creatinine, Ser: 0.94 mg/dL (ref 0.61–1.24)
GFR calc Af Amer: >60 mL/min (ref >60)
GFR calc non Af Amer: >60 mL/min (ref >60)
GLUCOSE: 121 mg/dL — AB (ref 70–99)
POTASSIUM: 3.8 mmol/L (ref 3.5–5.1)
Sodium: 138 mmol/L (ref 135–145)

## 2018-08-05 LAB — RMSF, IGG, IFA: RMSF, IGG, IFA: 1:64 {titer} — ABNORMAL HIGH

## 2018-08-05 LAB — ROCKY MTN SPOTTED FVR ABS PNL(IGG+IGM)
RMSF IGG: POSITIVE — AB
RMSF IGM: 0.3 {index} (ref 0.00–0.89)

## 2018-08-05 LAB — MAGNESIUM: Magnesium: 1.9 mg/dL (ref 1.7–2.4)

## 2018-08-05 LAB — GLUCOSE, CAPILLARY: Glucose-Capillary: 127 mg/dL — ABNORMAL HIGH (ref 70–99)

## 2018-08-05 MED ORDER — DOXYCYCLINE HYCLATE 100 MG PO TABS: 100.0000 mg | ORAL_TABLET | Freq: Two times a day (BID) | ORAL | 0 refills | Status: AC

## 2018-08-05 NOTE — Progress Notes (Signed)
Discharge instruction provided to both patient and son. Both expressed understanding of teaching.

## 2018-08-05 NOTE — Discharge Summary (Signed)
Physician Discharge Summary  Harry Bowen WGN:[id number] DOB: 07-20-1951 DOA: 08/02/2018  PCP: Estanislado Pandy, MD  Admit date: 08/02/2018 Discharge date: 08/05/2018  Time spent: 35 minutes  Recommendations for Outpatient Follow-up:  1. Repeat BMET to follow electrolytes and renal function. 2. Assure resolution of patient symptoms.    Discharge Diagnoses:    Ataxia   Essential hypertension   CAD (coronary artery disease)   Dyslipidemia   Type 2 diabetes mellitus (HCC)   Tick borne fever with positive RMSF titers.    AKI.  Discharge Condition: stable and improved. discharge home with instructions to follow up with PCP in 10 days.  Diet recommendation: heart healthy and modified carb diet.  Filed Weights   08/02/18 2300 08/03/18 0500 08/04/18 0500  Weight: 87.5 kg 87.5 kg 87.8 kg    History of present illness:  As per H&P written by Dr. Robb Matar on 08/02/18 67 y.o. male with medical history significant of CAD history of anterior STEMI, S/P PCI to LAD, chronic diastolic CHF, hypertension, chronic renal disease who is coming to the emergency department with complaints of right hand tremor and increased lethargy for the past 3 to 4 days.  He noticed that he had a tick bite in his abdomen about 2 weeks ago.  He denies fever, chills, night sweats, sore throat, dyspnea, hemoptysis, wheezing, chest pain, palpitations, dizziness, diaphoresis, PND, orthopnea or pitting edema of the lower extremities.  No nausea, emesis, abdominal pain, diarrhea, constipation, melena or hematochezia.  Denies dysuria, frequency or hematuria.  No heat or cold intolerance.  No polyphagia, polydipsia or polyuria.  Hospital Course:  1. Lethargy and neurological sequelae suspected to be secondary to tickborne illness/RMSF.  Studies came back positive for RMSF titers. Patient treated with IV doxycycline for 3 days, until afebrile and then discharge on antibiotic course by mouth for 10 more days. CSF analysis essentially  negative and discarding meningitis.   2. Essential hypertension.  Currently well controlled and will maintain on lisinopril and metoprolol as ordered. 3. CAD.  Continue aspirin, metoprolol, lisinopril, and simvastatin.  No complaints of chest pain reported.  Patient also denies chest pain or shortness of breath.. 4. Dyslipidemia.  Continue Zocor. 5. Type 2 diabetes-good control.  We will continue metformin and encourage him to follow modified carb diet. 6. AKI: in the setting of dehydration. Resolved with IVF's. Renal function back to normal at discharge.  Procedures:  See below for x-ray reports   Consultations:  ID curbside   Discharge Exam: Vitals:   08/05/18 0907 08/05/18 1000  BP: 139/77   Pulse:    Resp:  18  Temp:    SpO2:     General exam: Alert, awake, oriented x 3. Finally afebrile for 48 hours. Also reported no CP, SOB or palpitations.  Respiratory system: Clear to auscultation. Respiratory effort normal. Cardiovascular system:RRR. No murmurs, rubs, gallops. Gastrointestinal system: Abdomen is nondistended, soft and nontender. No organomegaly or masses felt. Normal bowel sounds heard. Central nervous system: Alert and oriented. No focal neurological deficits. Extremities: No C/C/E, +pedal pulses. MS 5/5 bilaterally. No significant tremors appreciated. Skin: No rashes, lesions or ulcers Psychiatry: Judgement and insight appear normal. Mood & affect appropriate.    Discharge Instructions   Discharge Instructions    Diet - low sodium heart healthy   Complete by:  As directed    Discharge instructions   Complete by:  As directed    Take medications as prescribed  Keep yourself well hydrated Follow heart healthy  diet Please arrange follow up with PCP     Allergies as of 08/05/2018      Reactions   Lipitor [atorvastatin] Other (See Comments)   Muscle aches- tolerates Zocor      Medication List    TAKE these medications   doxycycline 100 MG tablet Commonly  known as:  VIBRA-TABS Take 1 tablet (100 mg total) by mouth every 12 (twelve) hours for 10 days.   lisinopril 10 MG tablet Commonly known as:  PRINIVIL,ZESTRIL Take 10 mg by mouth every morning.   metFORMIN 500 MG 24 hr tablet Commonly known as:  GLUCOPHAGE-XR Take 500 mg by mouth at bedtime.   metoprolol succinate 25 MG 24 hr tablet Commonly known as:  TOPROL-XL TAKE ONE TABLET BY MOUTH EVERY DAY What changed:    how much to take  how to take this  when to take this   nitroGLYCERIN 0.4 MG SL tablet Commonly known as:  NITROSTAT Place 1 tablet (0.4 mg total) under the tongue every 5 (five) minutes as needed for chest pain.   simvastatin 40 MG tablet Commonly known as:  ZOCOR TAKE 1 TABLET BY MOUTH DAILY AT 6 PM.      Allergies  Allergen Reactions  . Lipitor [Atorvastatin] Other (See Comments)    Muscle aches- tolerates Zocor   Follow-up Information    Sasser, Clarene Critchley, MD. Schedule an appointment as soon as possible for a visit in 10 day(s).   Specialty:  Family Medicine Contact information: [address] [place] [address] [phone]           The results of significant diagnostics from this hospitalization (including imaging, microbiology, ancillary and laboratory) are listed below for reference.    Significant Diagnostic Studies: Dg Chest 2 View  Result Date: 08/02/2018 CLINICAL DATA:  Weakness for 1 and half weeks. EXAM: CHEST - 2 VIEW COMPARISON:  None. FINDINGS: Mild cardiomegaly. Hila and mediastinum are normal. Minimal atelectasis in the left base. No other abnormalities in the lungs. IMPRESSION: No active cardiopulmonary disease. Electronically Signed   By: Gerome Sam III M.D   On: 08/02/2018 20:11   Ct Head Wo Contrast  Result Date: 08/02/2018 CLINICAL DATA:  67 year old male with history of right-sided weakness for the past few days. EXAM: CT HEAD WITHOUT CONTRAST TECHNIQUE: Contiguous axial images were obtained from the base of the skull through  the vertex without intravenous contrast. COMPARISON:  No priors. FINDINGS: Brain: No evidence of acute infarction, hemorrhage, hydrocephalus, extra-axial collection or mass lesion/mass effect. Vascular: No hyperdense vessel or unexpected calcification. Skull: Normal. Negative for fracture or focal lesion. Sinuses/Orbits: No acute finding. Other: None. IMPRESSION: 1. No acute intracranial abnormalities. The appearance of the brain is normal. Electronically Signed   By: Trudie Reed M.D.   On: 08/02/2018 20:16   Mr Lumbar Spine Wo Contrast  Result Date: 07/29/2018 CLINICAL DATA:  Low back pain with weakness and numbness in both legs, worst in the right foot. Symptoms are chronic. No known injury. EXAM: MRI LUMBAR SPINE WITHOUT CONTRAST TECHNIQUE: Multiplanar, multisequence MR imaging of the lumbar spine was performed. No intravenous contrast was administered. COMPARISON:  CT abdomen and pelvis 10/06/2016. FINDINGS: Segmentation:  Standard. Alignment:  No listhesis.  Mild convex right scoliosis noted. Vertebrae: No fracture or worrisome lesion. Degenerative endplate signal change is identified at L4-5 and a few small hemangiomas are noted. Conus medullaris and cauda equina: Conus extends to the L2 level. Conus and cauda equina appear normal. Paraspinal and other  soft tissues: Negative. Disc levels: T11-12 is imaged in the sagittal plane only and negative. T12-L1: Mild facet degenerative disease.  Otherwise negative. L1-2: Mild facet degenerative change.  Otherwise negative. L2-3: Minimal disc bulge to the left without stenosis. L3-4: The patient has a shallow disc bulge with a superimposed left subarticular recess protrusion. There is some ligamentum flavum thickening and mild facet degenerative change. Narrowing in the subarticular recesses could impact either descending L4 root and appears worse on the left. The foramina are open. L4-5: There is marked loss of disc space height, ligamentum flavum thickening  and moderate bilateral facet degenerative disease. Calcification of the annulus is seen in the disc bulge on the prior CT. Moderate to moderately severe central canal stenosis is present and there is narrowing in both subarticular recesses with impingement on the L5 roots. Mild right foraminal narrowing is noted. The left foramen is open. L5-S1: Broad-based central and right side disc protrusion is present. Calcification of the annulus about the protrusion is seen on the prior CT. There is narrowing in the subarticular recesses which is worse on the right. The right S1 root is posteriorly deflected and CSF about the root is effaced. The foramina are open. The thecal sac is patent. IMPRESSION: Moderate to moderately severe central canal stenosis and narrowing of both subarticular recesses at L4-5 with impingement on the L5 roots. Broad-based central and right side disc protrusion at L5-S1 impinges on the right S1 root. The right S1 root is deflected and appears compressed. There is also a milder degree of impingement on the left S1 root. Disc bulge and a superimposed left subarticular recess protrusion at L3-4 result in impingement on the L4 roots, worse on the left. Electronically Signed   By: Drusilla Kannerhomas  Dalessio M.D.   On: 07/29/2018 14:50    Microbiology: Recent Results (from the past 240 hour(s))  Blood culture (routine x 2)     Status: None   Collection Time: 08/02/18  7:12 PM  Result Value Ref Range Status   Specimen Description BLOOD RIGHT ARM  Final   Special Requests   Final    BOTTLES DRAWN AEROBIC AND ANAEROBIC Blood Culture adequate volume   Culture   Final    NO GROWTH 84 DAYS Performed at [facility], [address] [place], [place] [address]    Report Status 08/03/2018 FINAL  Final  Blood culture (routine x 2)     Status: None (Preliminary result)   Collection Time: 08/02/18  7:14 PM  Result Value Ref Range Status   Specimen Description BLOOD RIGHT ARM  Final   Special Requests    Final    BOTTLES DRAWN AEROBIC AND ANAEROBIC Blood Culture adequate volume   Culture   Final    NO GROWTH 3 DAYS Performed at [facility], [address] [place], [place] [address]    Report Status PENDING  Incomplete  CSF culture with Stat gram stain     Status: None (Preliminary result)   Collection Time: 08/02/18  9:09 PM  Result Value Ref Range Status   Specimen Description   Final    CSF Performed at [facility], [address] [place], [place] [address]    Special Requests   Final    Normal Performed at [facility], [address] [place], [place] [address]    Gram Stain   Final    CYTOSPIN SMEAR NO ORGANISMS SEEN NO WBC SEEN Performed at [facility] Performed at [facility], [address] [place], [place] [address]    Culture   Final    NO GROWTH 3 DAYS Performed at [facility], [address], [place], [place] [address]    Report Status PENDING  Incomplete  MRSA PCR Screening     Status: None   Collection Time: 08/02/18 11:11 PM  Result Value Ref Range Status   MRSA by PCR NEGATIVE NEGATIVE Final    Comment:        The GeneXpert MRSA Assay (FDA approved for NASAL specimens only), is one component of a comprehensive MRSA colonization surveillance program. It is not intended to diagnose MRSA infection nor to guide or monitor treatment for MRSA infections. Performed at [facility], [address] [place], [place] [address]      Labs: Basic Metabolic Panel: Recent Labs  Lab 08/02/18 1850 08/03/18 0415 08/04/18 0415 08/05/18 0446  NA 131* 135 136 138  K 4.0 3.9 3.5 3.8  CL 96* 104 105 106  CO2 27 26 27 26   GLUCOSE 120* 113* 121* 121*  BUN 15 15 16 13   CREATININE 1.31* 1.19 1.10 0.94  CALCIUM 8.5* 8.0* 7.6* 8.1*  MG  --   --   --  1.9   Liver Function Tests: Recent Labs  Lab 08/02/18 1850 08/03/18 0415  AST 30 28  ALT 22 21  ALKPHOS 67 58  BILITOT 1.2 1.3*  PROT 7.6 6.5  ALBUMIN 4.1 3.5   CBC: Recent Labs  Lab  08/02/18 1850 08/03/18 0415 08/04/18 0415  WBC 4.9 4.1 3.0*  NEUTROABS 3.4 2.8  --   HGB 13.0 12.0* 11.3*  HCT 39.4 36.9* 34.7*  MCV 91.0 90.9 91.1  PLT 185 153 126*   CBG: Recent Labs  Lab 08/04/18 0731 08/04/18 1128 08/04/18 1604 08/04/18 2057 08/05/18 0728  GLUCAP 121* 139* 143* 178* 127*    Signed:  Vassie Loll MD.  [facility] 08/05/2018, 1:13 PM

## 2018-08-06 LAB — CSF CULTURE

## 2018-08-06 LAB — CSF CULTURE W GRAM STAIN
Culture: NO GROWTH
Special Requests: NORMAL

## 2018-08-07 LAB — LYME DISEASE DNA BY PCR(BORRELIA BURG): LYME DISEASE(B. BURGDORFERI) PCR: NEGATIVE

## 2018-08-07 LAB — CULTURE, BLOOD (ROUTINE X 2)
CULTURE: NO GROWTH
SPECIAL REQUESTS: ADEQUATE

## 2018-08-13 LAB — B. BURGDORFI ANTIBODIES, CSF

## 2018-08-24 LAB — LYME DISEASE DNA BY PCR(BORRELIA BURG): LYME DISEASE(B. BURGDORFERI) PCR: NEGATIVE

## 2018-10-11 ENCOUNTER — Other Ambulatory Visit: Payer: Self-pay

## 2018-10-11 ENCOUNTER — Telehealth: Payer: Self-pay | Admitting: Neurology

## 2018-10-11 ENCOUNTER — Encounter: Payer: Self-pay | Admitting: Neurology

## 2018-10-11 ENCOUNTER — Ambulatory Visit: Payer: 59 | Admitting: Neurology

## 2018-10-11 VITALS — BP 139/83 | HR 65 | Resp 16 | Ht 73.0 in | Wt 189.0 lb

## 2018-10-11 DIAGNOSIS — G20C Parkinsonism, unspecified: Secondary | ICD-10-CM | POA: Insufficient documentation

## 2018-10-11 DIAGNOSIS — G20 Parkinson's disease: Secondary | ICD-10-CM | POA: Diagnosis not present

## 2018-10-11 NOTE — Progress Notes (Signed)
PATIENT: Harry Bowen DOB: 1951/01/19  Chief Complaint  Patient presents with  . Tremors    Rm. 4.  RUE:AVWU Sasser.  [name] is here for eval of tremor right hand, onset 3 mos. ago and esp. noted when he is driving/fim     HISTORICAL  Harry Bowen is a 67 year old male, seen in request by his primary care physician Dr. Fara Chute for evaluation of right hand tremor, initial evaluation was on October 15, 2018.  I have reviewed and summarized the referring note from the referring physician.  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  Since summer 2019, he noticed gradual onset right hand tremor, when holding the wheels, also noticed mild change in the way he walks, but denies significant difficulty,  he was to the hospital from August to 5-8 2019 for generalized fatigue, hand tremor, history of tick exposure, serology came back positive for [facility] spotted fever titer, he was treated with IV doxycycline for 3 days, followed by p.o. 100 mg twice a day for 10 days,  His fatigue has improved, but his right hand tremor remained,  He denies a family history of dementia, Alzheimer's disease  Personally reviewed CT head without contrast August 2019 that was normal  I personally reviewed MRI lumbar in August 2019, moderate to severe central canal stenosis, narrowing of both subarticular recess at L4-5 with potential impingement on L5, broad-based central and right-sided disc protrusion at L5-S1 impingement on right S1 nerve roots,  Laboratory evaluations in August 2019,, BMP showed mild elevated glucose 121, showed mildly decreased hemoglobin of  11.3, HIV,   REVIEW OF SYSTEMS: Full 14 system review of systems performed and notable only for as above All other review of systems were negative.  ALLERGIES: Allergies  Allergen Reactions  . Lipitor [Atorvastatin] Other (See Comments)    Muscle aches- tolerates Zocor    HOME MEDICATIONS: Current Outpatient Medications  Medication Sig Dispense Refill  . lisinopril (PRINIVIL,ZESTRIL) 10 MG tablet Take 10 mg by mouth every morning.   12  . metFORMIN (GLUCOPHAGE-XR) 500 MG 24 hr tablet Take 500 mg by mouth at bedtime.     . metoprolol succinate (TOPROL-XL) 25 MG 24 hr tablet TAKE ONE TABLET BY MOUTH EVERY DAY (Patient taking differently: TAKE ONE TABLET BY MOUTH EVERY DAY IN THE EVENING) 30 tablet 6  . nitroGLYCERIN (NITROSTAT) 0.4 MG SL tablet Place 1 tablet (0.4 mg total) under the tongue every 5 (five) minutes as needed for chest pain. 25 tablet 3  . simvastatin (ZOCOR) 40 MG tablet TAKE 1 TABLET BY MOUTH DAILY AT 6 PM. 30 tablet 6   No current facility-administered medications for this visit.     PAST MEDICAL HISTORY: Past Medical History:  Diagnosis Date  . CAD (coronary artery disease)    a. 04/2015 Ant STEMI/PCI:  LM nl, LAD 100p w/ R->L Collats (3.5x23 Xience DES), LCX nl, RCA nl, EF 45%.  . Diabetes mellitus without complication (HCC)   . Hypertension   . Renal disorder     PAST SURGICAL HISTORY: Past Surgical History:  Procedure Laterality Date  . CARDIAC CATHETERIZATION N/A 05/08/2015   Procedure: Left Heart Cath and Coronary Angiography;  Surgeon: Lennette Bihari, MD;  Location: [facility] INVASIVE CV LAB;  Service: Cardiovascular;  Laterality: N/A;  . PERCUTANEOUS CORONARY STENT INTERVENTION (PCI-S)  05/08/2015   Procedure: Percutaneous Coronary Stent Intervention (Pci-S);  Surgeon: Lennette Bihari, MD;  Location: [facility] INVASIVE CV LAB;  Service: Cardiovascular;;    FAMILY HISTORY: Family History  Problem Relation Age of Onset  . Other Father        MVA caused death  .  Heart attack Paternal Grandfather     SOCIAL HISTORY: Social History   Socioeconomic History  . Marital status: Married    Spouse name: Not on file  . Number of children: Not on file  . Years of education: Not on file  . Highest education level: Not on file  Occupational History  . Not on file  Social Needs  . Financial resource strain: Not on file  . Food insecurity:    Worry: Not on file    Inability: Not on file  . Transportation needs:    Medical: Not on file    Non-medical: Not on file  Tobacco Use  . Smoking status: Never Smoker  . Smokeless tobacco: Never Used  Substance and Sexual Activity  . Alcohol use: No    Alcohol/week: 0.0 standard drinks  . Drug use: Never  . Sexual activity: Not on file  Lifestyle  . Physical activity:    Days per week: Not on file    Minutes per session: Not on file  . Stress: Not on file  Relationships  . Social connections:    Talks on phone: Not on file    Gets together: Not on file    Attends religious service: Not on file    Active member of club or organization: Not on file    Attends meetings of clubs or organizations: Not on file    Relationship status: Not on file  . Intimate partner violence:    Fear of current or ex partner: Not on file    Emotionally abused: Not on file    Physically abused: Not on file    Forced sexual activity: Not on file  Other Topics Concern  . Not on file  Social History Narrative  . Not on file     PHYSICAL EXAM   Vitals:   10/11/18 0717  BP: 139/83  Pulse: 65  Resp: 16  Weight: 189 lb (85.7 kg)  Height: 6\' 1"  (1.854 m)    Not recorded      Body mass index is 24.94 kg/m.  PHYSICAL EXAMNIATION:  Gen: NAD, conversant, well nourised, obese, well groomed                     Cardiovascular: Regular rate rhythm, no peripheral edema, warm, nontender. Eyes: Conjunctivae clear without exudates or hemorrhage Neck: Supple, no carotid bruits. Pulmonary: Clear to auscultation  bilaterally   NEUROLOGICAL EXAM:  MENTAL STATUS: Speech:    Speech is normal; fluent and spontaneous with normal comprehension.  Cognition:     Orientation to time, place and person     Normal recent and remote memory     Normal Attention span and concentration     Normal Language, naming, repeating,spontaneous speech     Fund of knowledge   CRANIAL NERVES: CN II: Visual fields are full to confrontation. Fundoscopic exam is normal with sharp discs and no vascular changes. Pupils are round equal and briskly reactive to light. CN III, IV, VI: extraocular movement are normal.  No ptosis. CN V: Facial sensation is intact to pinprick in all 3 divisions bilaterally. Corneal responses are intact.  CN VII: Face is symmetric with normal eye closure and smile. CN VIII: Hearing is normal to rubbing fingers CN IX, X: Palate elevates symmetrically. Phonation is normal. CN XI: Head turning and shoulder shrug are intact CN XII: Tongue is midline with normal movements and no atrophy.  MOTOR: He has right hand resting tremor, right more than left side rigidity, no weakness, dyskinesia  REFLEXES: Reflexes are 2+ and symmetric at the biceps, triceps, knees, and ankles. Plantar responses are flexor.  SENSORY: Intact to light touch, pinprick, positional sensation and vibratory sensation are intact in fingers and toes.  COORDINATION: Rapid alternating movements and fine finger movements are intact. There is no dysmetria on finger-to-nose and heel-knee-shin.    GAIT/STANCE: Mildly decreased right arm swing, moderate stride, smooth turning, mild retropulsion stability. Romberg is absent.   DIAGNOSTIC DATA (LABS, IMAGING, TESTING) - I reviewed patient records, labs, notes, testing and imaging myself where available.   ASSESSMENT AND PLAN  Harry Bowen is a 67 y.o. male   Parkinsonism  Most suggestive of idiopathic Parkinson's disease  Datscan  Emphasize important of moderate  exercise  Return to clinic in 6 months   Levert Feinstein, M.D. Ph.D.  [facility] [address], [address] [place], [place] [address] Ph: [phone] Fax: [phone]  CC: Referring Provider

## 2018-10-11 NOTE — Telephone Encounter (Signed)
Called Patient and left him a message stating I was going to mail him a consent for his DAT scan he needed to sign before I could submit to his insurance. Thanks Annabelle Harman.

## 2018-10-15 ENCOUNTER — Encounter: Payer: Self-pay | Admitting: Neurology

## 2018-10-21 ENCOUNTER — Telehealth: Payer: Self-pay | Admitting: Neurology

## 2018-10-21 NOTE — Telephone Encounter (Signed)
Pt requesting a call stating he would like to discuss going on Parkinsons medication to help his tremor

## 2018-10-21 NOTE — Telephone Encounter (Signed)
DatScan test pending completion.  Per Dr. Terrace Arabia, she will wait to make a determination on medication until after his DatScan results have been reviewed.  He is aware and agreeable to this plan.

## 2018-10-27 ENCOUNTER — Telehealth: Payer: Self-pay | Admitting: Neurology

## 2018-10-27 NOTE — Telephone Encounter (Signed)
Spoke to patient - he wanted to make sure Dr. Terrace Arabia knew he went to [facility] on 08/02/18.  He is aware she has access to his records.

## 2018-10-27 NOTE — Telephone Encounter (Signed)
Pt is asking for a call from RN to discuss a stay he had in the hospital over the Summer, he feels it may be beneficial for Dr Terrace Arabia to know about it.  Please call

## 2018-11-08 ENCOUNTER — Other Ambulatory Visit: Payer: Self-pay | Admitting: Cardiovascular Disease

## 2018-11-15 ENCOUNTER — Ambulatory Visit: Payer: 59 | Admitting: Cardiovascular Disease

## 2018-11-16 ENCOUNTER — Ambulatory Visit: Payer: 59 | Admitting: Cardiovascular Disease

## 2018-11-16 ENCOUNTER — Encounter

## 2018-12-06 ENCOUNTER — Telehealth: Payer: Self-pay | Admitting: Neurology

## 2018-12-06 NOTE — Telephone Encounter (Signed)
Melony with [facility] precert calling regarding Datscan for the patient. Melony can be reached at [phone].

## 2018-12-07 ENCOUNTER — Telehealth: Payer: Self-pay | Admitting: Neurology

## 2018-12-07 ENCOUNTER — Other Ambulatory Visit: Payer: Self-pay | Admitting: Cardiology

## 2018-12-07 NOTE — Telephone Encounter (Signed)
I spoke with Cathrine MusterStetphanie Cooley to see what I need to do with getting the Datscan approved she then put me in touched with Jeannine with [employer] and she informed me that it already has been approved.   [facility] Auth: [id number] (exp. 11/04/18 to 02/02/19).   I spoke with Shawna [facility]Melanie [facility] and gave her the authorization information.

## 2018-12-07 NOTE — Telephone Encounter (Signed)
[email or web address] is asking for a call re: a needed authorization.  Shawna OrleansMelanie is asking for a call on her direct dial of [phone]

## 2018-12-08 NOTE — Telephone Encounter (Signed)
Noted Thanks Irving Burtonmily . I have sent approval to [facility].

## 2018-12-08 NOTE — Telephone Encounter (Signed)
Thanks Irving Burtonmily Noted

## 2018-12-09 ENCOUNTER — Encounter (HOSPITAL_COMMUNITY): Payer: 59

## 2018-12-09 ENCOUNTER — Ambulatory Visit (HOSPITAL_COMMUNITY): Admission: RE | Admit: 2018-12-09 | Payer: 59 | Source: Ambulatory Visit

## 2018-12-27 ENCOUNTER — Ambulatory Visit: Payer: 59 | Admitting: Cardiovascular Disease

## 2019-01-03 NOTE — Telephone Encounter (Signed)
Updated Approval DAT Scan.

## 2019-01-03 NOTE — Telephone Encounter (Signed)
Working on DAT [employer]. Called and left [place] a message . Thanks Annabelle Harman

## 2019-01-03 NOTE — Telephone Encounter (Signed)
[facility] has called to inform that re: the DAT scan for [facility] a Pre Authorization is needed.  You can reach [place] @ [phone] xt [phone]

## 2019-01-05 ENCOUNTER — Encounter (HOSPITAL_COMMUNITY)
Admission: RE | Admit: 2019-01-05 | Discharge: 2019-01-05 | Disposition: A | Discharge status: Home / Self Care | Payer: 59 | Source: Ambulatory Visit | Attending: Neurology | Admitting: Neurology

## 2019-01-05 ENCOUNTER — Ambulatory Visit (HOSPITAL_COMMUNITY)
Admission: RE | Admit: 2019-01-05 | Discharge: 2019-01-05 | Disposition: A | Discharge status: Home / Self Care | Payer: 59 | Source: Ambulatory Visit | Attending: Neurology | Admitting: Neurology

## 2019-01-05 DIAGNOSIS — G20 Parkinson's disease: Secondary | ICD-10-CM | POA: Insufficient documentation

## 2019-01-05 MED ORDER — IODINE STRONG (LUGOLS) 5 % PO SOLN
0.8000 mL | Freq: Once | ORAL | Status: AC
  Administered 2019-01-05: 0.8 mL via ORAL

## 2019-01-06 ENCOUNTER — Telehealth: Payer: Self-pay | Admitting: Neurology

## 2019-01-06 NOTE — Telephone Encounter (Signed)
Please call patient DATSCAN showed decreased dopamine transporter activity in the posterior left Striata, the pattern is consistent with Parkinson's disease.   IMPRESSION: Decreased dopamine transporter activity in the posterior LEFT striata is a pattern consistent with Parkinson's syndrome pathology.

## 2019-01-06 NOTE — Telephone Encounter (Signed)
error 

## 2019-01-06 NOTE — Telephone Encounter (Signed)
Spoke to patient and he is aware of his results.  He will keep his pending follow up appointment with Dr. Terrace Arabia to further discuss these findings.

## 2019-01-12 ENCOUNTER — Ambulatory Visit: Payer: 59 | Admitting: Neurology

## 2019-01-12 ENCOUNTER — Encounter: Payer: Self-pay | Admitting: Neurology

## 2019-01-12 VITALS — BP 128/81 | HR 79 | Ht 73.0 in | Wt 192.0 lb

## 2019-01-12 DIAGNOSIS — G20 Parkinson's disease: Secondary | ICD-10-CM

## 2019-01-12 NOTE — Progress Notes (Signed)
PATIENT: Harry Bowen DOB: 1951/10/20  Chief Complaint  Patient presents with  . Parkinson's Disease    He is here to discuss the results of his DATSCAN.     HISTORICAL  Harry Bowen is a 68 year old male, seen in request by his primary care physician Dr. Fara Chute for evaluation of right hand tremor, initial evaluation was on October 15, 2018.  I have reviewed and summarized the referring note from the referring physician.  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  Since summer 2019, he noticed gradual onset right hand tremor, when holding the wheels, also noticed mild change in the way he walks, but denies significant difficulty,  he was admitted to the hospital from August to 5-8 2019 for generalized fatigue, hand tremor, history of tick exposure, serology came back positive for [facility] spotted fever titer, he was treated with IV doxycycline for 3 days, followed by p.o. 100 mg twice a day for 10 days,  His fatigue has improved, but his right hand tremor remained,  He denies a family history of dementia, Alzheimer's disease  Personally reviewed CT head without contrast August 2019 that was normal  I personally reviewed MRI lumbar in August 2019, moderate to severe central canal stenosis, narrowing of both subarticular recess at L4-5 with potential impingement on L5, broad-based central and right-sided disc protrusion at L5-S1 impingement on right S1 nerve roots,  Laboratory evaluations in August 2019,, BMP showed mild elevated glucose 121, showed mildly decreased hemoglobin of 11.3, HIV,  UPDATE Jan 15th 2020: He is accompanied by  his son at today's visit, we have personally reviewed DATscan on January 05, 2019, decreased dopamine transporter activity in the posterior left striata, in a pattern consistent with Parkinson syndrome pathology,  He continues to have mild right hand resting tremor, denies significant right-sided weakness, no gait abnormality, he has mild left arm weakness due to previous residual deficit from left cervical radiculopathy  REVIEW OF SYSTEMS: Full 14 system review of systems performed and notable only for as above All other review of systems were negative.  ALLERGIES: Allergies  Allergen Reactions  . Lipitor [Atorvastatin] Other (See Comments)    Muscle aches- tolerates Zocor    HOME MEDICATIONS: Current Outpatient Medications  Medication Sig Dispense Refill  . aspirin 81 MG tablet Take 81 mg by mouth as needed for pain.    Marland Kitchen lisinopril (PRINIVIL,ZESTRIL) 10 MG tablet Take 10 mg by mouth every morning.   12  . metFORMIN (GLUCOPHAGE-XR) 500 MG 24 hr tablet Take 500 mg by mouth at bedtime.     . metoprolol succinate (TOPROL-XL) 25 MG 24 hr tablet TAKE 1 TABLET BY MOUTH EVERY DAY 30 tablet 6  . nitroGLYCERIN (NITROSTAT) 0.4 MG SL tablet Place 1 tablet (0.4 mg total) under the tongue every 5 (five) minutes as needed for chest pain. 25 tablet 3  . simvastatin (ZOCOR) 40 MG tablet TAKE 1 TABLET BY MOUTH DAILY AT 6PM 30 tablet 6   No current facility-administered medications for this visit.     PAST MEDICAL HISTORY: Past Medical History:  Diagnosis Date  . CAD (coronary artery disease)    a. 04/2015 Ant STEMI/PCI:  LM nl,  LAD 100p w/ R->L Collats (3.5x23 Xience DES), LCX nl, RCA nl, EF 45%.  . Diabetes mellitus without complication (HCC)   . Hypertension   . Renal disorder     PAST SURGICAL HISTORY: Past Surgical History:  Procedure Laterality Date  . CARDIAC CATHETERIZATION N/A 05/08/2015   Procedure: Left Heart Cath and Coronary Angiography;  Surgeon: Lennette Biharihomas A Kelly, MD;  [facility];  Service: Cardiovascular;  Laterality: N/A;  . PERCUTANEOUS CORONARY STENT INTERVENTION (PCI-S)  05/08/2015   Procedure: Percutaneous Coronary Stent Intervention (Pci-S);  Surgeon: Lennette Biharihomas A Kelly, MD;  [facility];  Service: Cardiovascular;;    FAMILY HISTORY: Family History  Problem Relation Age of Onset  . Other Father        MVA caused death  . Heart attack Paternal Grandfather     SOCIAL HISTORY: Social History   Socioeconomic History  . Marital status: Married    Spouse name: Not on file  . Number of children: Not on file  . Years of education: Not on file  . Highest education level: Not on file  Occupational History  . Not on file  Social Needs  . Financial resource strain: Not on file  . Food insecurity:    Worry: Not on file    Inability: Not on file  . Transportation needs:    Medical: Not on file    Non-medical: Not on file  Tobacco Use  . Smoking status: Never Smoker  . Smokeless tobacco: Never Used  Substance and Sexual Activity  . Alcohol use: No    Alcohol/week: 0.0 standard drinks  . Drug use: Never  . Sexual activity: Not on file  Lifestyle  . Physical activity:    Days per week: Not on file    Minutes per session: Not on file  . Stress: Not on file  Relationships  . Social connections:    Talks on phone: Not on file    Gets together: Not on file    Attends religious service: Not on file    Active member of club or organization: Not on file    Attends meetings of clubs or organizations: Not on file    Relationship status: Not on file  . Intimate partner violence:    Fear of current or ex partner: Not on file    Emotionally abused: Not on file    Physically abused: Not on file    Forced sexual activity: Not on file  Other Topics Concern  . Not on file  Social History Narrative  . Not on file     PHYSICAL EXAM   Vitals:   01/12/19 1301  BP: 128/81  Pulse: 79  Weight: 192 lb (87.1 kg)  Height: 6\' 1"   (1.854 m)    Not recorded      Body mass index is 25.33 kg/m.  PHYSICAL EXAMNIATION:  Gen: NAD, conversant, well nourised, obese, well groomed                     Cardiovascular: Regular rate rhythm, no peripheral edema, warm, nontender. Eyes: Conjunctivae clear without exudates or hemorrhage Neck: Supple, no carotid bruits. Pulmonary: Clear to auscultation bilaterally   NEUROLOGICAL EXAM:  MENTAL STATUS: Speech:    Speech is normal; fluent and spontaneous with normal comprehension.  Cognition:     Orientation to time, place and person     Normal recent and remote memory     Normal Attention span and  concentration     Normal Language, naming, repeating,spontaneous speech     Fund of knowledge   CRANIAL NERVES: CN II: Visual fields are full to confrontation.Pupils are round equal and briskly reactive to light. CN III, IV, VI: extraocular movement are normal. No ptosis. CN V: Facial sensation is intact to pinprick in all 3 divisions bilaterally. Corneal responses are intact.  CN VII: Face is symmetric with normal eye closure and smile. CN VIII: Hearing is normal to rubbing fingers CN IX, X: Palate elevates symmetrically. Phonation is normal. CN XI: Head turning and shoulder shrug are intact CN XII: Tongue is midline with normal movements and no atrophy.  MOTOR: He has right hand resting tremor, right more than left side rigidity, no significant bradykinesia, mild left arm proximal muscle weakness from residual deficit of previous cervical radiculopathy REFLEXES: Reflexes are 2+ and symmetric at the biceps, triceps, knees, and ankles. Plantar responses are flexor.  SENSORY: Intact to light touch, pinprick, positional sensation and vibratory sensation are intact in fingers and toes.  COORDINATION: Rapid alternating movements and fine finger movements are intact. There is no dysmetria on finger-to-nose and heel-knee-shin.    GAIT/STANCE: Mildly decreased right arm swing,  moderate stride, smooth turning, mild retropulsion stability. Romberg is absent.   DIAGNOSTIC DATA (LABS, IMAGING, TESTING) - I reviewed patient records, labs, notes, testing and imaging myself where available.   ASSESSMENT AND PLAN  Harry Bowen is a 68 y.o. male   Parkinsonism  Most suggestive of idiopathic Parkinson's disease  Datscan confirmed decreased dopamine transporter activity in the posterior left strata,  Emphasize important of moderate exercise  We decided to hold off treatment at this point,   Levert FeinsteinYijun Antonius Hartlage, M.D. Ph.D.  [facility] [address], [address] [place], [place] [address] Ph: [phone]) [phone] Fax: [phone]  CC: Referring Provider

## 2019-01-24 ENCOUNTER — Ambulatory Visit: Payer: Self-pay | Admitting: Neurology

## 2019-02-08 ENCOUNTER — Encounter: Payer: Self-pay | Admitting: Cardiovascular Disease

## 2019-02-08 ENCOUNTER — Ambulatory Visit: Payer: 59 | Admitting: Cardiovascular Disease

## 2019-02-08 ENCOUNTER — Encounter: Payer: Self-pay | Admitting: *Deleted

## 2019-02-08 VITALS — BP 143/82 | HR 63 | Ht 73.0 in | Wt 195.4 lb

## 2019-02-08 DIAGNOSIS — I252 Old myocardial infarction: Secondary | ICD-10-CM

## 2019-02-08 DIAGNOSIS — Z955 Presence of coronary angioplasty implant and graft: Secondary | ICD-10-CM | POA: Diagnosis not present

## 2019-02-08 DIAGNOSIS — E785 Hyperlipidemia, unspecified: Secondary | ICD-10-CM

## 2019-02-08 DIAGNOSIS — I25118 Atherosclerotic heart disease of native coronary artery with other forms of angina pectoris: Secondary | ICD-10-CM

## 2019-02-08 DIAGNOSIS — G20 Parkinson's disease: Secondary | ICD-10-CM

## 2019-02-08 DIAGNOSIS — I10 Essential (primary) hypertension: Secondary | ICD-10-CM

## 2019-02-08 NOTE — Progress Notes (Signed)
SUBJECTIVE: The patient presents for routine follow-up.  He was hospitalized for lethargy in August 2019 and [facility].  I have not evaluated him since November 2018.  He sustained an ST elevation myocardial infarction in 04/2015 and received a drug-eluting stent to the LAD. Ejection fraction was 45% by left ventriculography, with moderate to severe hypokinesis of the mid to distal anterolateral wall and apex and distal inferior apex.  The circumflex and right coronary artery were angiographically normal. He also has hypertension, diabetes mellitus, and dyslipidemia.  He is a Customer service managerstructural inspector by profession.  He developed a right hand tremor in the latter part of last year and has been diagnosed with idiopathic Parkinson's disease.  He is not on treatment at this time.  The patient denies any symptoms of chest pain, palpitations, shortness of breath, lightheadedness, dizziness, leg swelling, orthopnea, PND, and syncope.     Review of Systems: As per "subjective", otherwise negative.  Allergies  Allergen Reactions  . Lipitor [Atorvastatin] Other (See Comments)    Muscle aches- tolerates Zocor    Current Outpatient Medications  Medication Sig Dispense Refill  . aspirin 81 MG tablet Take 81 mg by mouth as needed for pain.    Marland Kitchen. lisinopril (PRINIVIL,ZESTRIL) 10 MG tablet Take 10 mg by mouth every morning.   12  . metFORMIN (GLUCOPHAGE-XR) 500 MG 24 hr tablet Take 500 mg by mouth at bedtime.     . metoprolol succinate (TOPROL-XL) 25 MG 24 hr tablet TAKE 1 TABLET BY MOUTH EVERY DAY 30 tablet 6  . nitroGLYCERIN (NITROSTAT) 0.4 MG SL tablet Place 1 tablet (0.4 mg total) under the tongue every 5 (five) minutes as needed for chest pain. 25 tablet 3  . simvastatin (ZOCOR) 40 MG tablet TAKE 1 TABLET BY MOUTH DAILY AT 6PM 30 tablet 6   No current facility-administered medications for this visit.     Past Medical History:  Diagnosis Date  . CAD  (coronary artery disease)    a. 04/2015 Ant STEMI/PCI:  LM nl, LAD 100p w/ R->L Collats (3.5x23 Xience DES), LCX nl, RCA nl, EF 45%.  . Diabetes mellitus without complication (HCC)   . Hypertension   . Renal disorder     Past Surgical History:  Procedure Laterality Date  . CARDIAC CATHETERIZATION N/A 05/08/2015   Procedure: Left Heart Cath and Coronary Angiography;  Surgeon: Lennette Biharihomas A Kelly, MD;  [facility];  Service: Cardiovascular;  Laterality: N/A;  . PERCUTANEOUS CORONARY STENT INTERVENTION (PCI-S)  05/08/2015   Procedure: Percutaneous Coronary Stent Intervention (Pci-S);  Surgeon: Lennette Biharihomas A Kelly, MD;  [facility];  Service: Cardiovascular;;    Social History   Socioeconomic History  . Marital status: Married    Spouse name: Not on file  . Number of children: Not on file  . Years of education: Not on file  . Highest education level: Not on file  Occupational History  . Not on file  Social Needs  . Financial resource strain: Not on file  . Food insecurity:    Worry: Not on file    Inability: Not on file  . Transportation needs:    Medical: Not on file    Non-medical: Not on file  Tobacco Use  . Smoking status: Never Smoker  . Smokeless tobacco: Never Used  Substance and Sexual Activity  . Alcohol use: No    Alcohol/week: 0.0 standard drinks  . Drug use: Never  .  Sexual activity: Not on file  Lifestyle  . Physical activity:    Days per week: Not on file    Minutes per session: Not on file  . Stress: Not on file  Relationships  . Social connections:    Talks on phone: Not on file    Gets together: Not on file    Attends religious service: Not on file    Active member of club or organization: Not on file    Attends meetings of clubs or organizations: Not on file    Relationship status: Not on file  . Intimate partner violence:    Fear of current or ex partner: Not on file    Emotionally abused: Not on file    Physically abused:  Not on file    Forced sexual activity: Not on file  Other Topics Concern  . Not on file  Social History Narrative  . Not on file     Vitals:   02/08/19 1133  BP: (!) 143/82  Pulse: 63  Weight: 195 lb 6.4 oz (88.6 kg)  Height: 6\' 1"  (1.854 m)    Wt Readings from Last 3 Encounters:  02/08/19 195 lb 6.4 oz (88.6 kg)  01/12/19 192 lb (87.1 kg)  10/11/18 189 lb (85.7 kg)     PHYSICAL EXAM General: NAD HEENT: Normal. Neck: No JVD, no thyromegaly. Lungs: Clear to auscultation bilaterally with normal respiratory effort. CV: Regular rate and rhythm, normal S1/S2, no S3/S4, no murmur. No pretibial or periankle edema.  No carotid bruit.   Abdomen: Soft, nontender, no distention.  Neurologic: Alert and oriented.  Psych: Normal affect. Skin: Normal. Musculoskeletal: No gross deformities.    ECG: Reviewed above under Subjective   Labs: Lab Results  Component Value Date/Time   K 3.8 08/05/2018 04:46 AM   BUN 13 08/05/2018 04:46 AM   CREATININE 0.94 08/05/2018 04:46 AM   ALT 21 08/03/2018 04:15 AM   HGB 11.3 (L) 08/04/2018 04:15 AM     Lipids: Lab Results  Component Value Date/Time   LDLCALC 91 05/09/2015 06:45 AM   CHOL 144 05/09/2015 06:45 AM   TRIG 87 05/09/2015 06:45 AM   HDL 36 (L) 05/09/2015 06:45 AM       ASSESSMENT AND PLAN: 1.  Coronary artery disease: History of STEMI with drug-eluting stent placement to the LAD.  Symptomatically stable.  Continue aspirin, lisinopril, Toprol-XL, and simvastatin.  2.  Hypertension: Blood pressure is mildly elevated.  This will need further monitoring.  3.  Hyperlipidemia: Continue simvastatin 40 mg.  I will obtain a copy of lipids from PCP.  4.  Right hand tremor/idiopathic Parkinson's disease: Currently not on treatment.  DaTscan showed decreased dopamine transporter activity in the posterior left striata in a pattern consistent with Parkinson's syndrome pathology.  He follows with neurology.    Disposition: Follow up  1 year   Prentice Docker, M.D., F.A.C.C.

## 2019-02-08 NOTE — Patient Instructions (Signed)
Your physician wants you to follow-up in: 1 YEAR WITH DR KONESWARAN You will receive a reminder letter in the mail two months in advance. If you don't receive a letter, please call our office to schedule the follow-up appointment.  Your physician recommends that you continue on your current medications as directed. Please refer to the Current Medication list given to you today.  Thank you for choosing [facility]!!    

## 2019-02-14 ENCOUNTER — Ambulatory Visit: Payer: 59 | Admitting: Neurology

## 2019-05-03 ENCOUNTER — Telehealth: Payer: Self-pay | Admitting: *Deleted

## 2019-05-03 NOTE — Telephone Encounter (Signed)
Called pt, cx appt on 05/19/19 and r/s for sooner appt on 05/04/19 since pt on wait list. He would like to do telephone visit. I updated medication list, pharmacy and allergies on file.

## 2019-05-04 ENCOUNTER — Encounter: Payer: Self-pay | Admitting: Neurology

## 2019-05-04 ENCOUNTER — Other Ambulatory Visit: Payer: Self-pay

## 2019-05-04 ENCOUNTER — Ambulatory Visit (INDEPENDENT_AMBULATORY_CARE_PROVIDER_SITE_OTHER): Payer: 59 | Admitting: Neurology

## 2019-05-04 DIAGNOSIS — G20 Parkinson's disease: Secondary | ICD-10-CM | POA: Diagnosis not present

## 2019-05-04 NOTE — Progress Notes (Signed)
PATIENT: Harry Bowen DOB: 06-Mar-1951   HISTORICAL  Harry Bowen is a 68 year old male, seen in request by his primary care physician Dr. Fara Chute for evaluation of right hand tremor, initial evaluation was on October 15, 2018.  I have reviewed and summarized the referring note from the referring physician.  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  Since summer 2019, he noticed gradual onset right hand tremor, when holding the wheels, also noticed mild change in the way he walks, but denies significant difficulty,  he was admitted to the hospital from August to 5-8 2019 for generalized fatigue, hand tremor, history of tick exposure, serology came back positive for [facility] spotted fever titer, he was treated with IV doxycycline for 3 days, followed by p.o. 100 mg twice a day for 10 days,  His fatigue has improved, but his right hand tremor remained,  He denies a family history of dementia, Alzheimer's disease  Personally reviewed CT head without contrast August 2019 that was normal  I personally reviewed MRI lumbar in August 2019, moderate to severe central canal stenosis, narrowing of both subarticular recess at L4-5 with potential impingement on L5, broad-based central and right-sided disc protrusion at L5-S1 impingement on right S1 nerve roots,  Laboratory evaluations in August 2019,, BMP showed mild elevated glucose 121, showed mildly decreased hemoglobin of 11.3, HIV,  UPDATE Jan 15th 2020: He is accompanied by his son at today's visit, we have personally reviewed DATscan on January 05, 2019, decreased dopamine transporter activity  in the posterior left striata, in a pattern consistent with Parkinson syndrome pathology,  He continues to have mild right hand resting tremor, denies significant right-sided weakness, no gait abnormality, he has mild left arm weakness due to previous residual deficit from left cervical radiculopathy   Virtual Visit via telephone  I connected with Harry Bowen on 05/04/19 at  by telephone and verified that I am speaking with the correct person using two identifiers.   I discussed the limitations, risks, security and privacy concerns of performing an evaluation and management service by telephone and the availability of in person appointments. I also discussed with the patient that there may be a patient responsible charge related to this service. The patient expressed understanding and agreed to proceed.   History of Present Illness: He complains of mild increased gait abnormality, contributed to his most recent onset left ankle pain, is scheduled him to see orthopedic surgeon, also noticed increased tremor on his right side, has good sense of smell, sleep well,   Observations/Objective: I have reviewed problem lists, medications, allergies. Awake alert oriented to history taking care of conversation  Assessment and Plan: Parkinsonism  Most suggestive of idiopathic Parkinson's disease  Datscan confirmed decreased dopamine transporter activity in the posterior left strata,  Emphasize important of moderate exercise  We decided to hold off treatment at this point   Follow Up Instructions:   6 months   I discussed the assessment and treatment plan with the patient. The patient was provided an opportunity to ask questions and all were answered. The patient agreed with the plan and demonstrated an understanding of the instructions.   The patient was advised  to call back or seek an in-person evaluation if the symptoms worsen or if the condition fails to improve as anticipated.  I  provided 25 minutes of non-face-to-face time during this encounter.   Levert FeinsteinYijun Aracelys Glade, MD

## 2019-05-11 ENCOUNTER — Ambulatory Visit: Payer: 59 | Admitting: Orthopaedic Surgery

## 2019-05-11 ENCOUNTER — Other Ambulatory Visit: Payer: Self-pay

## 2019-05-12 ENCOUNTER — Encounter: Payer: Self-pay | Admitting: Orthopaedic Surgery

## 2019-05-12 ENCOUNTER — Ambulatory Visit: Payer: Self-pay

## 2019-05-12 ENCOUNTER — Ambulatory Visit (INDEPENDENT_AMBULATORY_CARE_PROVIDER_SITE_OTHER): Payer: 59 | Admitting: Orthopaedic Surgery

## 2019-05-12 VITALS — Ht 73.0 in | Wt 192.0 lb

## 2019-05-12 DIAGNOSIS — M25572 Pain in left ankle and joints of left foot: Secondary | ICD-10-CM | POA: Diagnosis not present

## 2019-05-12 DIAGNOSIS — I25118 Atherosclerotic heart disease of native coronary artery with other forms of angina pectoris: Secondary | ICD-10-CM

## 2019-05-12 NOTE — Progress Notes (Signed)
Office Visit Note   Patient: Harry Bowen           Date of Birth: April 20, 1951           MRN: [id number] Visit Date: 05/12/2019              Requested by: Sasser, Clarene Critchley, MD [address] [place], [place] [address] PCP: Estanislado Pandy, MD   Assessment & Plan: Visit Diagnoses:  1. Pain in left ankle and joints of left foot     Plan: Patient has some radiographic ankle mild to moderate osteoarthritis his range of motion is maintained no ligamentous instability.  He can manage this with occasionally on days when he saw her expecting more walking you can take a couple extra plain aspirin tablets with food.  Best he avoid anti-inflammatories with his cardiac history.  He has increased symptoms increased limping or pain he can return.  X-rays were reviewed as well as x-ray findings.  Follow-Up Instructions: No follow-ups on file.   Orders:  Orders Placed This Encounter  Procedures  . XR Ankle Complete Left   No orders of the defined types were placed in this encounter.     Procedures: No procedures performed   Clinical Data: No additional findings.   Subjective: Chief Complaint  Patient presents with  . Left Ankle - Pain    HPI 68 year old male with left ankle pain intermittent.  At times he sometimes limps and sometimes stays tender.  He is occasionally noticed slight swelling.  Tends to have more tenderness laterally over the peroneal tendon.  It is gotten slightly worse in the last year.  He thinks this his ankle he sprained back when he played baseball.  [facility]  No history of gout he does have diabetes is on metformin also takes medication for cholesterol and hypertension.  Cardiac stent 2016 takes a baby aspirin a day.  Review of Systems positive for type 2 diabetes.  STEMI with stent.  Dyslipidemia.  Tickborne fever.  Mild Parkinson's.  Positive hypertension.  Otherwise 14 point systems negative.   Objective: Vital Signs: Ht  6\' 1"  (1.854 m)   Wt 192 lb (87.1 kg)   BMI 25.33 kg/m   Physical Exam Constitutional:      Appearance: He is well-developed.  HENT:     Head: Normocephalic and atraumatic.  Eyes:     Pupils: Pupils are equal, round, and reactive to light.  Neck:     Thyroid: No thyromegaly.     Trachea: No tracheal deviation.  Cardiovascular:     Rate and Rhythm: Normal rate.  Pulmonary:     Effort: Pulmonary effort is normal.     Breath sounds: No wheezing.  Abdominal:     General: Bowel sounds are normal.     Palpations: Abdomen is soft.  Skin:    General: Skin is warm and dry.     Capillary Refill: Capillary refill takes less than 2 seconds.  Neurological:     Mental Status: He is alert and oriented to person, place, and time.  Psychiatric:        Behavior: Behavior normal.        Thought Content: Thought content normal.        Judgment: Judgment normal.     Ortho Exam  Patient has negative anterior drawer.  Slightly short stride somewhat deliberate gait.  Good ankle range of motion passively.  Subtalar motions normal toe extension  anterior tib peroneals posterior tib are strong.  He has some tenderness over the peroneal tendon sheath starting at the fibula extending 7 to 10 cm.  No tenderness over the peroneal brevis insertion.  Achilles tendon is normal plantar fascial normal no plantar foot lesions.  Specialty Comments:  No specialty comments available.  Imaging: No results found.   PMFS History: Patient Active Problem List   Diagnosis Date Noted  . Parkinsonism (HCC) 10/11/2018  . Tick borne fever   . Ataxia 08/02/2018  . Type 2 diabetes mellitus (HCC) 08/02/2018  . Dyslipidemia 05/10/2015  . CAD (coronary artery disease)   . Essential hypertension 05/08/2015  . Diabetes mellitus (HCC) 05/08/2015  . ST elevation myocardial infarction (STEMI) involving left anterior descending (LAD) coronary artery with complication (HCC) 05/08/2015   Past Medical History:  Diagnosis  Date  . CAD (coronary artery disease)    a. 04/2015 Ant STEMI/PCI:  LM nl, LAD 100p w/ R->L Collats (3.5x23 Xience DES), LCX nl, RCA nl, EF 45%.  . Diabetes mellitus without complication (HCC)   . Hypertension   . Renal disorder     Family History  Problem Relation Age of Onset  . Other Father        MVA caused death  . Heart attack Paternal Grandfather     Past Surgical History:  Procedure Laterality Date  . CARDIAC CATHETERIZATION N/A 05/08/2015   Procedure: Left Heart Cath and Coronary Angiography;  Surgeon: Lennette Bihari, MD;  Location: [facility] INVASIVE CV LAB;  Service: Cardiovascular;  Laterality: N/A;  . PERCUTANEOUS CORONARY STENT INTERVENTION (PCI-S)  05/08/2015   Procedure: Percutaneous Coronary Stent Intervention (Pci-S);  Surgeon: Lennette Bihari, MD;  Location: [facility] INVASIVE CV LAB;  Service: Cardiovascular;;   Social History   Occupational History  . Not on file  Tobacco Use  . Smoking status: Never Smoker  . Smokeless tobacco: Never Used  Substance and Sexual Activity  . Alcohol use: No    Alcohol/week: 0.0 standard drinks  . Drug use: Never  . Sexual activity: Not on file

## 2019-05-19 ENCOUNTER — Encounter

## 2019-05-19 ENCOUNTER — Ambulatory Visit: Payer: 59 | Admitting: Neurology

## 2019-06-07 ENCOUNTER — Other Ambulatory Visit: Payer: Self-pay | Admitting: Cardiovascular Disease

## 2019-07-14 ENCOUNTER — Other Ambulatory Visit: Payer: Self-pay | Admitting: Cardiology

## 2019-11-15 ENCOUNTER — Ambulatory Visit (INDEPENDENT_AMBULATORY_CARE_PROVIDER_SITE_OTHER): Payer: 59 | Admitting: Neurology

## 2019-11-15 ENCOUNTER — Other Ambulatory Visit: Payer: Self-pay

## 2019-11-15 ENCOUNTER — Encounter: Payer: Self-pay | Admitting: Neurology

## 2019-11-15 VITALS — BP 138/80 | HR 66 | Temp 97.7°F | Ht 73.0 in | Wt 188.5 lb

## 2019-11-15 DIAGNOSIS — G20 Parkinson's disease: Secondary | ICD-10-CM

## 2019-11-15 DIAGNOSIS — I25118 Atherosclerotic heart disease of native coronary artery with other forms of angina pectoris: Secondary | ICD-10-CM

## 2019-11-15 MED ORDER — ROPINIROLE HCL ER 2 MG PO TB24: 4.0000 mg | ORAL_TABLET | Freq: Every day | ORAL | 11 refills | Status: DC

## 2019-11-15 NOTE — Progress Notes (Signed)
PATIENT: Harry Bowen DOB: 1951/02/07  Chief Complaint  Patient presents with  . Tremors    Reports his right hand tremor has progressed over the last six months.     HISTORICAL  Harry Bowen is a 68 year old male, seen in request by his primary care physician Dr. Fara Chute for evaluation of right hand tremor, initial evaluation was on October 15, 2018.  I have reviewed and summarized the referring note from the referring physician.  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  Since summer 2019, he noticed gradual onset right hand tremor, when holding the wheels, also noticed mild change in the way he walks, but denies significant difficulty,  he was admitted to the hospital from August to 5-8 2019 for generalized fatigue, hand tremor, history of tick exposure, serology came back positive for [facility] spotted fever titer, he was treated with IV doxycycline for 3 days, followed by p.o. 100 mg twice a day for 10 days,  His fatigue has improved, but his right hand tremor remained,  He denies a family history of dementia, Alzheimer's disease  Personally reviewed CT head without contrast August 2019 that was normal  I personally reviewed MRI lumbar in August 2019, moderate to severe central canal stenosis, narrowing of both subarticular recess at L4-5 with potential impingement on L5, broad-based central and right-sided disc protrusion at L5-S1 impingement on right S1 nerve roots,  Laboratory evaluations in August 2019,, BMP showed mild elevated glucose 121, showed mildly decreased hemoglobin of 11.3, HIV,  UPDATE Jan 15th 2020: He is  accompanied by his son at today's visit, we have personally reviewed DATscan on January 05, 2019, decreased dopamine transporter activity in the posterior left striata, in a pattern consistent with Parkinson syndrome pathology,  He continues to have mild right hand resting tremor, denies significant right-sided weakness, no gait abnormality, he has mild left arm weakness due to previous residual deficit from left cervical radiculopathy  UPDATE Nov 15 2019: He is here to follow-up for idiopathic Parkinson's disease, confirmed by abnormal DATscan in January 2020, he complains of increased right hand tremor, denies significant gait abnormalities, he takes care of his wife, who suffered stroke, also receiving dialysis,  REVIEW OF SYSTEMS: Full 14 system review of systems performed and notable only for as above All other review of systems were negative.  ALLERGIES: Allergies  Allergen Reactions  . Lipitor [Atorvastatin] Other (See Comments)    Muscle aches- tolerates Zocor    HOME MEDICATIONS: Current Outpatient Medications  Medication Sig Dispense Refill  . aspirin 81 MG tablet Take 81 mg by mouth as needed for pain.    Marland Kitchen lisinopril (PRINIVIL,ZESTRIL) 10 MG tablet Take 10 mg by mouth every morning.   12  . metoprolol succinate (TOPROL-XL) 25 MG 24 hr tablet TAKE 1 TABLET BY MOUTH EVERY DAY 30 tablet 6  . nitroGLYCERIN (NITROSTAT) 0.4 MG SL tablet Place 1 tablet (0.4 mg total) under the tongue every 5 (five) minutes as needed for chest pain. 25 tablet 3  . simvastatin (ZOCOR) 40 MG tablet TAKE 1 TABLET BY MOUTH DAILY AT 6PM 30 tablet 6   No current facility-administered medications for this visit.  PAST MEDICAL HISTORY: Past Medical History:  Diagnosis Date  . CAD (coronary artery disease)    a. 04/2015 Ant STEMI/PCI:  LM nl, LAD 100p w/ R->L Collats (3.5x23 Xience DES), LCX nl, RCA nl, EF 45%.  . Diabetes mellitus without complication (HCC)   . Hypertension   . Renal disorder   .  Tremor     PAST SURGICAL HISTORY: Past Surgical History:  Procedure Laterality Date  . CARDIAC CATHETERIZATION N/A 05/08/2015   Procedure: Left Heart Cath and Coronary Angiography;  Surgeon: Lennette Biharihomas A Kelly, MD;  Location: [facility] INVASIVE CV LAB;  Service: Cardiovascular;  Laterality: N/A;  . PERCUTANEOUS CORONARY STENT INTERVENTION (PCI-S)  05/08/2015   Procedure: Percutaneous Coronary Stent Intervention (Pci-S);  Surgeon: Lennette Biharihomas A Kelly, MD;  [facility];  Service: Cardiovascular;;    FAMILY HISTORY: Family History  Problem Relation Age of Onset  . Other Mother        healthy other than chronic pain  . Other Father        MVA caused death  . Heart attack Paternal Grandfather     SOCIAL HISTORY: Social History   Socioeconomic History  . Marital status: Married    Spouse name: Not on file  . Number of children: 3  . Years of education: 6912  . Highest education level: High school graduate  Occupational History  . Not on file  Social Needs  . Financial resource strain: Not on file  . Food insecurity    Worry: Not on file    Inability: Not on file  . Transportation needs    Medical: Not on file    Non-medical: Not on file  Tobacco Use  . Smoking status: Never Smoker  . Smokeless tobacco: Never Used  Substance and Sexual Activity  . Alcohol use: No    Alcohol/week: 0.0 standard drinks  . Drug use: Never  . Sexual activity: Not on file  Lifestyle  . Physical activity    Days per week: Not on file    Minutes per session: Not on file  . Stress: Not on file  Relationships  . Social Musicianconnections    Talks on phone: Not on file    Gets together: Not on file    Attends religious service: Not on file    Active member of club or organization: Not on file    Attends meetings of clubs or organizations: Not on file    Relationship status: Not on file  . Intimate partner violence    Fear of current or ex partner: Not on file    Emotionally abused: Not on file     Physically abused: Not on file    Forced sexual activity: Not on file  Other Topics Concern  . Not on file  Social History Narrative   Lives at home with his wife.   Right-handed.   No daily caffeine use.     PHYSICAL EXAM   Vitals:   11/15/19 1044  BP: 138/80  Pulse: 66  Temp: 97.7 F (36.5 C)  Weight: 188 lb 8 oz (85.5 kg)  Height: 6\' 1"  (1.854 m)    Not recorded      Body mass index is 24.87 kg/m.  PHYSICAL EXAMNIATION:  Gen: NAD, conversant, well nourised, obese, well groomed                     Cardiovascular: Regular rate rhythm, no peripheral edema, warm, nontender. Eyes: Conjunctivae clear without exudates  or hemorrhage Neck: Supple, no carotid bruits. Pulmonary: Clear to auscultation bilaterally   NEUROLOGICAL EXAM:  MENTAL STATUS: Speech:    Speech is normal; fluent and spontaneous with normal comprehension.  Cognition:     Orientation to time, place and person     Normal recent and remote memory     Normal Attention span and concentration     Normal Language, naming, repeating,spontaneous speech     Fund of knowledge   CRANIAL NERVES: CN II: Visual fields are full to confrontation.Pupils are round equal and briskly reactive to light. CN III, IV, VI: extraocular movement are normal. No ptosis. CN V: Facial sensation is intact to pinprick in all 3 divisions bilaterally. Corneal responses are intact.  CN VII: Face is symmetric with normal eye closure and smile. CN VIII: Hearing is normal to rubbing fingers CN IX, X: Palate elevates symmetrically. Phonation is normal. CN XI: Head turning and shoulder shrug are intact CN XII: Tongue is midline with normal movements and no atrophy.  MOTOR: He has right hand resting tremor, right more than left side rigidity, mild bradykinesia, mild left arm proximal muscle weakness from residual deficit of previous cervical radiculopathy REFLEXES: Reflexes are 2+ and symmetric at the biceps, triceps, knees, and  ankles. Plantar responses are flexor.  SENSORY: Intact to light touch, pinprick, positional sensation and vibratory sensation are intact in fingers and toes.  COORDINATION: Rapid alternating movements and fine finger movements are intact. There is no dysmetria on finger-to-nose and heel-knee-shin.    GAIT/STANCE: Mildly decreased right arm swing, moderate stride, smooth turning, mild retropulsion stability. Romberg is absent.   DIAGNOSTIC DATA (LABS, IMAGING, TESTING) - I reviewed patient records, labs, notes, testing and imaging myself where available.   ASSESSMENT AND PLAN  Harry Bowen is a 68 y.o. male   Idiopathic Parkinson's disease  Most suggestive of idiopathic Parkinson's disease  Datscan in January 2020 confirmed decreased dopamine transporter activity in the posterior left strata,  Emphasize important of moderate exercise  Will start Requip XR 2 mg, titrating to 2 tablets every night   Marcial Pacas, M.D. Ph.D.  [facility] [address] [place], [place] [address] Ph: [phone] Fax: [phone]  CC: Referring Provider

## 2019-11-21 ENCOUNTER — Telehealth: Payer: Self-pay | Admitting: Neurology

## 2019-11-21 NOTE — Telephone Encounter (Signed)
Pt called in and stated the rOPINIRole (REQUIP XL) 2 MG 24 hr tablet isnt working and its giving him side effects, he states when he took it he became dizzy , with drooling sweats and leg pain

## 2019-11-21 NOTE — Telephone Encounter (Signed)
Patient reports issues with dizziness, increased drooling, feeling sweaty, and discomfort in legs after taking Requip XL 2mg , two tablets at bedtime.  He only tried it once but would like to give the medication a fair chance. He wanted to report this event to have it on record.  He plans to reduce the dose to one tablet at bedtime to see if he can tolerate it and to make sure his symptoms were related to the medication at all.  If he does okay on one tablet at bedtime, he will continue this dose for about a week then attempt to increase back to two tablets at bedtime.  If he has similar problems on the lower dose, he was instructed to call our office back.

## 2019-12-28 ENCOUNTER — Telehealth: Payer: Self-pay | Admitting: Neurology

## 2019-12-28 MED ORDER — CARBIDOPA-LEVODOPA 25-100 MG PO TABS: 1.0000 | ORAL_TABLET | Freq: Three times a day (TID) | ORAL | 11 refills | Status: DC

## 2019-12-28 NOTE — Addendum Note (Signed)
Addended by: Marcial Pacas on: 12/28/2019 05:21 PM   Modules accepted: Orders

## 2019-12-28 NOTE — Telephone Encounter (Signed)
Ok to stop Requip, start Sinemet 25/100 1/2 tid for one week after meal then one tab tid, I have called in new Rx.

## 2019-12-28 NOTE — Telephone Encounter (Signed)
Pt called stating that he is still having side effects from the rOPINIRole (REQUIP XL) 2 MG 24 hr tablet and is wanting to discuss this with the RN. Please advise.

## 2019-12-28 NOTE — Telephone Encounter (Signed)
I returned the call to the patient.  He verbalized understanding of the medication change and repeated the Sinemet dosing instructions back to me correctly.

## 2019-12-28 NOTE — Telephone Encounter (Signed)
I spoke to the patient.  He is taking Requip XL 2mg , one tablet at bedtime.  He is still having dizziness and vision changes that he feels started after taking the medication.  He was never able to titrate up to two tablets at bedtime.  Additionally, he is still having tremors.  He would like to know if he can stop this medication and try something else.

## 2020-01-17 ENCOUNTER — Other Ambulatory Visit: Payer: Self-pay | Admitting: Cardiovascular Disease

## 2020-02-16 ENCOUNTER — Other Ambulatory Visit: Payer: Self-pay | Admitting: Cardiology

## 2020-02-24 ENCOUNTER — Other Ambulatory Visit: Payer: Self-pay | Admitting: Cardiovascular Disease

## 2020-03-08 ENCOUNTER — Telehealth: Payer: Self-pay | Admitting: Neurology

## 2020-03-08 NOTE — Telephone Encounter (Signed)
Called patient who stated tremors are slightly progressing, he's fatigued., losing strength in legs. He would like a sooner FU to discuss medications. His wife has dialysis on T,Th, Sat so we scheduled for soonest with Dr Terrace Arabia. NP didn't have opening sooner. He was put on wait list. Patient verbalized understanding, appreciation.

## 2020-03-08 NOTE — Telephone Encounter (Signed)
Pt called stating that the carbidopa-levodopa (SINEMET IR) 25-100 MG tablet that he is on is not helping his tremors and is thinking he needs a change of medications. Please advise.

## 2020-03-20 ENCOUNTER — Telehealth: Payer: Self-pay | Admitting: Cardiovascular Disease

## 2020-03-20 NOTE — Telephone Encounter (Signed)
Virtual Visit Pre-Appointment Phone Call  "(Name), I am calling you today to discuss your upcoming appointment. We are currently trying to limit exposure to the virus that causes COVID-19 by seeing patients at home rather than in the office."  1. "What is the BEST phone number to call the day of the visit?" - include this in appointment notes  2. "Do you have or have access to (through a family member/friend) a smartphone with video capability that we can use for your visit?" a. If yes - list this number in appt notes as "cell" (if different from BEST phone #) and list the appointment type as a VIDEO visit in appointment notes b. If no - list the appointment type as a PHONE visit in appointment notes  3. Confirm consent - "In the setting of the current Covid19 crisis, you are scheduled for a (phone or video) visit with your provider on (date) at (time).  Just as we do with many in-office visits, in order for you to participate in this visit, we must obtain consent.  If you'd like, I can send this to your mychart (if signed up) or email for you to review.  Otherwise, I can obtain your verbal consent now.  All virtual visits are billed to your insurance company just like a normal visit would be.  By agreeing to a virtual visit, we'd like you to understand that the technology does not allow for your provider to perform an examination, and thus may limit your provider's ability to fully assess your condition. If your provider identifies any concerns that need to be evaluated in person, we will make arrangements to do so.  Finally, though the technology is pretty good, we cannot assure that it will always work on either your or our end, and in the setting of a video visit, we may have to convert it to a phone-only visit.  In either situation, we cannot ensure that we have a secure connection.  Are you willing to proceed?" STAFF: Did the patient verbally acknowledge consent to telehealth visit? Document  YES/NO here: yes  4. Advise patient to be prepared - "Two hours prior to your appointment, go ahead and check your blood pressure, pulse, oxygen saturation, and your weight (if you have the equipment to check those) and write them all down. When your visit starts, your provider will ask you for this information. If you have an Apple Watch or Kardia device, please plan to have heart rate information ready on the day of your appointment. Please have a pen and paper handy nearby the day of the visit as well."  5. Give patient instructions for MyChart download to smartphone OR Doximity/Doxy.me as below if video visit (depending on what platform provider is using)  6. Inform patient they will receive a phone call 15 minutes prior to their appointment time (may be from unknown caller ID) so they should be prepared to answer    TELEPHONE CALL NOTE  Harry Bowen has been deemed a candidate for a follow-up tele-health visit to limit community exposure during the Covid-19 pandemic. I spoke with the patient via phone to ensure availability of phone/video source, confirm preferred email & phone number, and discuss instructions and expectations.  I reminded Harry Bowen to be prepared with any vital sign and/or heart rhythm information that could potentially be obtained via home monitoring, at the time of his visit. I reminded Harry Bowen to expect a phone call prior to  his visit.  [name] 03/20/2020 3:50 PM   INSTRUCTIONS FOR DOWNLOADING THE MYCHART APP TO SMARTPHONE  - The patient must first make sure to have activated MyChart and know their login information - If Apple, go to [employer] and type in MyChart in the search bar and download the app. If Android, ask patient to go to [employer] and type in [place] in the search bar and download the app. The app is free but as with any other app downloads, their phone may require them to verify saved payment information or Apple/Android  password.  - The patient will need to then log into the app with their MyChart username and password, and select  as their healthcare provider to link the account. When it is time for your visit, go to the MyChart app, find appointments, and click Begin Video Visit. Be sure to Select Allow for your device to access the Microphone and Camera for your visit. You will then be connected, and your provider will be with you shortly.  **If they have any issues connecting, or need assistance please contact MyChart service desk [phone])**  **If using a computer, in order to ensure the best quality for their visit they will need to use either of the following Internet Browsers: [employer] or Google Chrome**  IF USING DOXIMITY or DOXY.ME - The patient will receive a link just prior to their visit by text.     FULL LENGTH CONSENT FOR TELE-HEALTH VISIT   I hereby voluntarily request, consent and authorize [facility] and its employed or contracted physicians, physician assistants, nurse practitioners or other licensed health care professionals (the Practitioner), to provide me with telemedicine health care services (the "Services") as deemed necessary by the treating Practitioner. I acknowledge and consent to receive the Services by the Practitioner via telemedicine. I understand that the telemedicine visit will involve communicating with the Practitioner through live audiovisual communication technology and the disclosure of certain medical information by electronic transmission. I acknowledge that I have been given the opportunity to request an in-person assessment or other available alternative prior to the telemedicine visit and am voluntarily participating in the telemedicine visit.  I understand that I have the right to withhold or withdraw my consent to the use of telemedicine in the course of my care at any time, without affecting my right to future care or treatment,  and that the Practitioner or I may terminate the telemedicine visit at any time. I understand that I have the right to inspect all information obtained and/or recorded in the course of the telemedicine visit and may receive copies of available information for a reasonable fee.  I understand that some of the potential risks of receiving the Services via telemedicine include:  Marland Kitchen Delay or interruption in medical evaluation due to technological equipment failure or disruption; . Information transmitted may not be sufficient (e.g. poor resolution of images) to allow for appropriate medical decision making by the Practitioner; and/or  . In rare instances, security protocols could fail, causing a breach of personal health information.  Furthermore, I acknowledge that it is my responsibility to provide information about my medical history, conditions and care that is complete and accurate to the best of my ability. I acknowledge that Practitioner's advice, recommendations, and/or decision may be based on factors not within their control, such as incomplete or inaccurate data provided by me or distortions of diagnostic images or specimens that may result from electronic transmissions. I  understand that the practice of medicine is not an exact science and that Practitioner makes no warranties or guarantees regarding treatment outcomes. I acknowledge that I will receive a copy of this consent concurrently upon execution via email to the email address I last provided but may also request a printed copy by calling the office of [facility].    I understand that my insurance will be billed for this visit.   I have read or had this consent read to me. . I understand the contents of this consent, which adequately explains the benefits and risks of the Services being provided via telemedicine.  . I have been provided ample opportunity to ask questions regarding this consent and the Services and have had my questions  answered to my satisfaction. . I give my informed consent for the services to be provided through the use of telemedicine in my medical care  By participating in this telemedicine visit I agree to the above.

## 2020-03-23 ENCOUNTER — Telehealth (INDEPENDENT_AMBULATORY_CARE_PROVIDER_SITE_OTHER): Payer: Medicare Other | Admitting: Cardiovascular Disease

## 2020-03-23 ENCOUNTER — Encounter: Payer: Self-pay | Admitting: Cardiovascular Disease

## 2020-03-23 VITALS — BP 130/77 | HR 67 | Ht 73.0 in | Wt 187.0 lb

## 2020-03-23 DIAGNOSIS — E785 Hyperlipidemia, unspecified: Secondary | ICD-10-CM | POA: Diagnosis not present

## 2020-03-23 DIAGNOSIS — I2510 Atherosclerotic heart disease of native coronary artery without angina pectoris: Secondary | ICD-10-CM | POA: Diagnosis not present

## 2020-03-23 DIAGNOSIS — I252 Old myocardial infarction: Secondary | ICD-10-CM

## 2020-03-23 DIAGNOSIS — Z8249 Family history of ischemic heart disease and other diseases of the circulatory system: Secondary | ICD-10-CM

## 2020-03-23 DIAGNOSIS — I10 Essential (primary) hypertension: Secondary | ICD-10-CM | POA: Diagnosis not present

## 2020-03-23 DIAGNOSIS — I25118 Atherosclerotic heart disease of native coronary artery with other forms of angina pectoris: Secondary | ICD-10-CM

## 2020-03-23 DIAGNOSIS — Z7189 Other specified counseling: Secondary | ICD-10-CM

## 2020-03-23 DIAGNOSIS — G20 Parkinson's disease: Secondary | ICD-10-CM

## 2020-03-23 DIAGNOSIS — Z7982 Long term (current) use of aspirin: Secondary | ICD-10-CM

## 2020-03-23 DIAGNOSIS — Z79899 Other long term (current) drug therapy: Secondary | ICD-10-CM

## 2020-03-23 DIAGNOSIS — Z955 Presence of coronary angioplasty implant and graft: Secondary | ICD-10-CM

## 2020-03-23 NOTE — Patient Instructions (Signed)

## 2020-03-23 NOTE — Progress Notes (Signed)
Virtual Visit via Telephone Note   This visit type was conducted due to national recommendations for restrictions regarding the COVID-19 Pandemic (e.g. social distancing) in an effort to limit this patient's exposure and mitigate transmission in our community.  Due to his co-morbid illnesses, this patient is at least at moderate risk for complications without adequate follow up.  This format is felt to be most appropriate for this patient at this time.  The patient did not have access to video technology/had technical difficulties with video requiring transitioning to audio format only (telephone).  All issues noted in this document were discussed and addressed.  No physical exam could be performed with this format.  Please refer to the patient's chart for his  consent to telehealth for [facility].   The patient was identified using 2 identifiers.  Date:  03/23/2020   ID:  Harry Bowen, DOB 09-13-1951, MRN [id number]  Patient Location: Home Provider Location: Home  PCP:  Estanislado Pandy, MD  Cardiologist:  Prentice Docker, MD  Electrophysiologist:  None   Evaluation Performed:  Follow-Up Visit  Chief Complaint:  CAD  History of Present Illness:    Harry Bowen is a 69 y.o. male with a history of CAD.   He sustained an ST elevation myocardial infarction in 04/2015 and received a drug-eluting stent to the LAD. Ejection fraction was 45% by left ventriculography, with moderate to severe hypokinesis of the mid to distal anterolateral wall and apex and distal inferior apex.  The circumflex and right coronary artery were angiographically normal.  He also has hypertension, diabetes mellitus, idiopathic Parkinson's disease, and dyslipidemia.  He is a Customer service manager by profession.  The patient denies any symptoms of chest pain, palpitations, shortness of breath, lightheadedness, dizziness, leg swelling, orthopnea, PND, and syncope.    Past Medical History:  Diagnosis Date    . CAD (coronary artery disease)    a. 04/2015 Ant STEMI/PCI:  LM nl, LAD 100p w/ R->L Collats (3.5x23 Xience DES), LCX nl, RCA nl, EF 45%.  . Diabetes mellitus without complication (HCC)   . Hypertension   . Renal disorder   . Tremor    Past Surgical History:  Procedure Laterality Date  . CARDIAC CATHETERIZATION N/A 05/08/2015   Procedure: Left Heart Cath and Coronary Angiography;  Surgeon: Lennette Bihari, MD;  Location: [facility] INVASIVE CV LAB;  Service: Cardiovascular;  Laterality: N/A;  . PERCUTANEOUS CORONARY STENT INTERVENTION (PCI-S)  05/08/2015   Procedure: Percutaneous Coronary Stent Intervention (Pci-S);  Surgeon: Lennette Bihari, MD;  Location: [facility] INVASIVE CV LAB;  Service: Cardiovascular;;     Current Meds  Medication Sig  . aspirin 81 MG tablet Take 81 mg by mouth as needed for pain.  Marland Kitchen lisinopril (PRINIVIL,ZESTRIL) 10 MG tablet Take 10 mg by mouth every morning.   . metoprolol succinate (TOPROL-XL) 25 MG 24 hr tablet TAKE 1 TABLET BY MOUTH EVERY DAY  . nitroGLYCERIN (NITROSTAT) 0.4 MG SL tablet Place 1 tablet (0.4 mg total) under the tongue every 5 (five) minutes as needed for chest pain.  . simvastatin (ZOCOR) 40 MG tablet TAKE 1 TABLET BY MOUTH DAILY AT 6PM     Allergies:   Lipitor [atorvastatin]   Social History   Tobacco Use  . Smoking status: Never Smoker  . Smokeless tobacco: Never Used  Substance Use Topics  . Alcohol use: No    Alcohol/week: 0.0 standard drinks  . Drug use: Never     Family Hx: The patient's  family history includes Heart attack in his paternal grandfather; Other in his father and mother.  ROS:   Please see the history of present illness.     All other systems reviewed and are negative.   Prior CV studies:   The following studies were reviewed today:  Reviewed above  Labs/Other Tests and Data Reviewed:    EKG:  No ECG reviewed.  Recent Labs: No results found for requested labs within last 8760 hours.   Recent Lipid Panel Lab  Results  Component Value Date/Time   CHOL 144 05/09/2015 06:45 AM   TRIG 87 05/09/2015 06:45 AM   HDL 36 (L) 05/09/2015 06:45 AM   CHOLHDL 4.0 05/09/2015 06:45 AM   LDLCALC 91 05/09/2015 06:45 AM    Wt Readings from Last 3 Encounters:  03/23/20 187 lb (84.8 kg)  11/15/19 188 lb 8 oz (85.5 kg)  05/12/19 192 lb (87.1 kg)     Objective:    Vital Signs:  BP 130/77   Pulse 67   Ht 6\' 1"  (1.854 m)   Wt 187 lb (84.8 kg)   BMI 24.67 kg/m    VITAL SIGNS:  reviewed  ASSESSMENT & PLAN:    1.  Coronary artery disease: History of STEMI with drug-eluting stent placement to the LAD.  Symptomatically stable.  Continue aspirin, lisinopril, Toprol-XL, and simvastatin.  2.  Hypertension: Blood pressure is normal. No changes.  3.  Hyperlipidemia: Continue simvastatin 40 mg. Goal LDL<70.  4. Idiopathic Parkinson's disease: He says this is progressing with respect to right hand tremors. Neurology notes reviewed.    COVID-19 Education: The signs and symptoms of COVID-19 were discussed with the patient and how to seek care for testing (follow up with PCP or arrange E-visit).  The importance of social distancing was discussed today.  Time:   Today, I have spent 15 minutes with the patient with telehealth technology discussing the above problems.     Medication Adjustments/Labs and Tests Ordered: Current medicines are reviewed at length with the patient today.  Concerns regarding medicines are outlined above.   Tests Ordered: No orders of the defined types were placed in this encounter.   Medication Changes: No orders of the defined types were placed in this encounter.   Follow Up:  In Person in 1 year(s)  Signed, Kate Sable, MD  03/23/2020 10:21 AM    [facility]

## 2020-03-26 ENCOUNTER — Other Ambulatory Visit: Payer: Self-pay | Admitting: Cardiovascular Disease

## 2020-04-19 ENCOUNTER — Telehealth: Payer: Self-pay | Admitting: Neurology

## 2020-04-19 ENCOUNTER — Encounter: Payer: Self-pay | Admitting: Neurology

## 2020-04-19 ENCOUNTER — Other Ambulatory Visit: Payer: Self-pay

## 2020-04-19 ENCOUNTER — Ambulatory Visit (INDEPENDENT_AMBULATORY_CARE_PROVIDER_SITE_OTHER): Payer: Medicare Other | Admitting: Neurology

## 2020-04-19 VITALS — BP 156/86 | HR 62 | Temp 97.6°F | Ht 73.0 in | Wt 188.0 lb

## 2020-04-19 DIAGNOSIS — I25118 Atherosclerotic heart disease of native coronary artery with other forms of angina pectoris: Secondary | ICD-10-CM | POA: Diagnosis not present

## 2020-04-19 DIAGNOSIS — R202 Paresthesia of skin: Secondary | ICD-10-CM | POA: Insufficient documentation

## 2020-04-19 DIAGNOSIS — G20 Parkinson's disease: Secondary | ICD-10-CM

## 2020-04-19 DIAGNOSIS — G2581 Restless legs syndrome: Secondary | ICD-10-CM | POA: Diagnosis not present

## 2020-04-19 MED ORDER — GABAPENTIN 100 MG PO CAPS: 300.0000 mg | ORAL_CAPSULE | Freq: Every day | ORAL | 11 refills | Status: DC

## 2020-04-19 MED ORDER — CARBIDOPA-LEVODOPA 25-100 MG PO TABS: 1.0000 | ORAL_TABLET | Freq: Three times a day (TID) | ORAL | 11 refills | Status: DC

## 2020-04-19 NOTE — Telephone Encounter (Signed)
Patient did not schedule NCV/EMG at check-out. Patient states he can only do appointments on Tuesdays and Thursdays. I advised patient that Dr. Terrace Arabia does EMGs on Monday/Wednesday only. Patient states this will not work for him. Do you have anything in mind for patient in this case?

## 2020-04-19 NOTE — Progress Notes (Addendum)
PATIENT: Harry Bowen DOB: [birth year]  Chief Complaint  Patient presents with  . Tremors    Reports his tremors, fatigue and leg weakness have worsened. He stopped Sinemet one week ago. Feels it was not helpful for his symptoms.      HISTORICAL  STEPHFON Bowen is a 69 year old male, seen in request by his primary care physician Dr. [name] for evaluation of right hand tremor, initial evaluation was on October 15, 2018.  I have reviewed and summarized the referring note from the referring physician.  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  Since summer 2019, he noticed gradual onset right hand tremor, when holding the wheels, also noticed mild change in the way he walks, but denies significant difficulty,  he was admitted to the hospital from August to 5-8 2019 for generalized fatigue, hand tremor, history of tic  exposure, serology came back positive for [facility] spotted fever titer, he was treated with IV doxycycline for 3 days, followed by p.o. 100 mg twice a day for 10 days,  His fatigue has improved, but his right hand tremor remained,  He denies a family history of dementia, Alzheimer's disease   CT head without contrast August 2019 that was normal  MRI lumbar in August 2019, moderate to severe central canal stenosis, narrowing of both subarticular recess at L4-5 with potential impingement on L5, broad-based central and right-sided disc protrusion at L5-S1 impingement on right S1 nerve roots,  Laboratory evaluations in August 2019,  BMP showed mild elevated glucose 121, showed mildly decreased hemoglobin of 11.3, HIV,  DATscan  on January 05, 2019, decreased dopamine transporter activity in the posterior left striata, in a pattern consistent with Parkinson syndrome pathology,  He continues to have mild right hand resting tremor, denies significant right-sided weakness, no gait abnormality, he has mild left arm weakness due to previous residual deficit from left cervical radiculopathy  UPDATE Nov 15 2019: He is here to follow-up for idiopathic Parkinson's disease, confirmed by abnormal DATscan in January 2020, he complains of increased right hand tremor, denies significant gait abnormalities, he takes care of his wife, who suffered stroke, also receiving dialysis,  UPDATE April 19 2020: Since last visit, he has tried Requip XR 2 mg every night, complains of an tolerable side effect, "make me like a zombie", later will try to Sinemet 25/100 mg 3 times daily, he tolerated the medication well, does help him move better, but only helped his right hand tremor mildly, he stopped taking the medication  He is a caregiver of his wife, who suffered stroke, total care, he denied depression, complains of difficulty sleeping, often experience right leg numbness, urge to move, also complains of bilateral feet paresthesia   REVIEW OF SYSTEMS: Full 14 system review of systems performed and notable only for as above All other review of systems were negative.  ALLERGIES: Allergies  Allergen Reactions  . Lipitor [Atorvastatin] Other (See Comments)    Muscle aches- tolerates Zocor    HOME MEDICATIONS: Current Outpatient Medications  Medication Sig Dispense Refill  . aspirin 81 MG tablet Take 81 mg by mouth as needed for pain.    Marland Kitchen lisinopril (PRINIVIL,ZESTRIL) 10 MG tablet  Take 10 mg by mouth every morning.   12  . metoprolol succinate (TOPROL-XL) 25 MG 24 hr tablet TAKE 1 TABLET BY MOUTH EVERY DAY 30 tablet 6  . nitroGLYCERIN (NITROSTAT) 0.4 MG SL tablet Place 1 tablet (0.4 mg total) under the tongue every 5 (five) minutes as  needed for chest pain. 25 tablet 3  . simvastatin (ZOCOR) 40 MG tablet TAKE 1 TABLET BY MOUTH DAILY AT 6PM 30 tablet 6   No current facility-administered medications for this visit.    PAST MEDICAL HISTORY: Past Medical History:  Diagnosis Date  . CAD (coronary artery disease)    a. 04/2015 Ant STEMI/PCI:  LM nl, LAD 100p w/ R->L Collats (3.5x23 Xience DES), LCX nl, RCA nl, EF 45%.  . Diabetes mellitus without complication (HCC)   . Hypertension   . Renal disorder   . Tremor     PAST SURGICAL HISTORY: Past Surgical History:  Procedure Laterality Date  . CARDIAC CATHETERIZATION N/A 05/08/2015   Procedure: Left Heart Cath and Coronary Angiography;  Surgeon: Lennette Bihari, MD;  Location: [facility] INVASIVE CV LAB;  Service: Cardiovascular;  Laterality: N/A;  . PERCUTANEOUS CORONARY STENT INTERVENTION (PCI-S)  05/08/2015   Procedure: Percutaneous Coronary Stent Intervention (Pci-S);  Surgeon: Lennette Bihari, MD;  Location: [facility] INVASIVE CV LAB;  Service: Cardiovascular;;    FAMILY HISTORY: Family History  Problem Relation Age of Onset  . Other Mother        healthy other than chronic pain  . Other Father        MVA caused death  . Heart attack Paternal Grandfather     SOCIAL HISTORY: Social History   Socioeconomic History  . Marital status: Married    Spouse name: Not on file  . Number of children: 3  . Years of education: 39  . Highest education level: High school graduate  Occupational History  . Not on file  Tobacco Use  . Smoking status: Never Smoker  . Smokeless tobacco: Never Used  Substance and Sexual Activity  . Alcohol use: No    Alcohol/week: 0.0 standard drinks  . Drug use: Never  . Sexual activity: Not on file  Other Topics Concern  . Not on file  Social History Narrative   Lives at home with his wife.   Right-handed.   No daily caffeine use.   Social Determinants of Health   Financial Resource Strain:   . Difficulty of Paying Living Expenses:   Food  Insecurity:   . Worried About Programme researcher, broadcasting/film/video in the Last Year:   . Barista in the Last Year:   Transportation Needs:   . Freight forwarder (Medical):   Marland Kitchen Lack of Transportation (Non-Medical):   Physical Activity:   . Days of Exercise per Week:   . Minutes of Exercise per Session:   Stress:   . Feeling of Stress :   Social Connections:   . Frequency of Communication with Friends and Family:   . Frequency of Social Gatherings with Friends and Family:   . Attends Religious Services:   . Active Member of Clubs or Organizations:   . Attends Banker Meetings:   Marland Kitchen Marital Status:   Intimate Partner Violence:   . Fear of Current or Ex-Partner:   . Emotionally Abused:   Marland Kitchen Physically Abused:   . Sexually Abused:      PHYSICAL EXAM   Vitals:   04/19/20 1004  BP: (!) 156/86  Pulse: 62  Temp: 97.6 F (36.4 C)  Weight: 188 lb (85.3 kg)  Height: 6\' 1"  (1.854 m)    Not recorded      Body mass index is 24.8 kg/m.  PHYSICAL EXAMNIATION:  Gen: NAD, conversant, well nourised, obese, well groomed                     Cardiovascular: Regular rate rhythm, no peripheral edema, warm, nontender. Eyes: Conjunctivae clear without exudates or hemorrhage Neck: Supple, no carotid bruits. Pulmonary: Clear to auscultation bilaterally   NEUROLOGICAL EXAM:  MENTAL STATUS: Speech:    Mildly microphonia; fluent and spontaneous with normal comprehension.  Cognition:     Orientation to time, place and person     Normal recent and remote memory     Normal Attention span and concentration     Normal Language, naming, repeating,spontaneous speech     Fund of knowledge   CRANIAL NERVES: CN II: Visual fields are full to confrontation.Pupils are round equal and briskly reactive to light. CN III, IV, VI: Mild limitation upper gaze CN V: Facial sensation is intact  CN VII: Face is symmetric with normal eye closure and smile. CN VIII: Hearing is normal to rubbing  fingers CN IX, X: Palate elevates symmetrically. Phonation is normal. CN XI: Head turning and shoulder shrug are intact    MOTOR: He has right hand resting tremor, right more than left side rigidity, mild bradykinesia, mild left arm proximal muscle weakness from residual deficit of previous cervical radiculopathy  REFLEXES: Reflexes are 2 and symmetric at the biceps, triceps, knees, and absent at ankles. Plantar responses are flexor.  SENSORY: Intact to light touch, pinprick, decreased vibratory sensation to mid shin level   COORDINATION: Rapid alternating movements and fine finger movements are intact. There is no dysmetria on finger-to-nose and heel-knee-shin.    GAIT/STANCE: He can get up from seated position arms crossed, mildly decreased right arm swing, moderate stride, smooth turning, mild retropulsion stability. Romberg is absent.   DIAGNOSTIC DATA (LABS, IMAGING, TESTING) - I reviewed patient records, labs, notes, testing and imaging myself where available.   ASSESSMENT AND PLAN  IVER MIKLAS is a 69 y.o. male   Idiopathic Parkinson's disease  Datscan in January 2020 confirmed decreased dopamine transporter activity in the posterior left strata,  Emphasize important of moderate exercise  He could not tolerate Requip XR 2 mg due to side effect, " make me feel like February 2020"  Start Sinemet 25/100 mg 3 times daily Restless legs  Decreased vibratory sensation, absent ankle reflex on examination, complains of bilateral feet paresthesia  EMG nerve conduction study for peripheral neuropathy  Laboratory evaluation for etiology including ferritin level  Gabapentin 100 mg titrating to 3 tablets every night   [place], M.D. Ph.D.  [facility] [address], [address] [place], [place] Ph: [phone] Fax: [phone]  CC: Referring Provider  Addendum: I reviewed the laboratory evaluation from his primary care Dr. [phone] on March 15, 2020, normal CMP, glucose was 141, creatinine is 1.05, A1c was 6.6, LDL was 57

## 2020-04-19 NOTE — Telephone Encounter (Signed)
We could do the afternoon of Tuesday May 4th or May 18th, if that works for her and the patient?

## 2020-04-19 NOTE — Telephone Encounter (Signed)
He has been scheduled on 5/4 - 3:15 for NCV then 4pm for EMG. He will arrive early for check-in.

## 2020-04-19 NOTE — Telephone Encounter (Signed)
Get laboratory evaluation from his  PCP

## 2020-04-19 NOTE — Telephone Encounter (Signed)
He is the caretaker of his wife, who suffered stroke, need total care, please find a schedule for his EMG/NCS

## 2020-04-19 NOTE — Telephone Encounter (Signed)
Request made today  

## 2020-04-19 NOTE — Telephone Encounter (Signed)
R/c labs on [employer].

## 2020-05-01 ENCOUNTER — Encounter: Payer: Self-pay | Admitting: Neurology

## 2020-05-15 ENCOUNTER — Ambulatory Visit: Payer: 59 | Admitting: Neurology

## 2020-06-12 ENCOUNTER — Other Ambulatory Visit: Payer: Self-pay

## 2020-06-12 ENCOUNTER — Telehealth: Payer: Self-pay | Admitting: Neurology

## 2020-06-12 ENCOUNTER — Ambulatory Visit (INDEPENDENT_AMBULATORY_CARE_PROVIDER_SITE_OTHER): Payer: Medicare Other | Admitting: Neurology

## 2020-06-12 DIAGNOSIS — M5412 Radiculopathy, cervical region: Secondary | ICD-10-CM | POA: Diagnosis not present

## 2020-06-12 DIAGNOSIS — G2581 Restless legs syndrome: Secondary | ICD-10-CM | POA: Diagnosis not present

## 2020-06-12 DIAGNOSIS — E1142 Type 2 diabetes mellitus with diabetic polyneuropathy: Secondary | ICD-10-CM

## 2020-06-12 DIAGNOSIS — I25118 Atherosclerotic heart disease of native coronary artery with other forms of angina pectoris: Secondary | ICD-10-CM | POA: Diagnosis not present

## 2020-06-12 DIAGNOSIS — G20 Parkinson's disease: Secondary | ICD-10-CM | POA: Diagnosis not present

## 2020-06-12 DIAGNOSIS — R202 Paresthesia of skin: Secondary | ICD-10-CM

## 2020-06-12 DIAGNOSIS — G20C Parkinsonism, unspecified: Secondary | ICD-10-CM

## 2020-06-12 NOTE — Progress Notes (Signed)
HISTORICAL  Harry Bowen is a 70 year old male, seen in request by his primary care physician Dr. Consuello Bowen for evaluation of right hand tremor, initial evaluation was on October 15, 2018.  I have reviewed and summarized the referring note from the referring physician.  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  Since summer 2019, he noticed gradual onset right hand tremor, when holding the wheels, also noticed mild change in the way he walks, but denies significant difficulty,  he was admitted to the hospital from August to 5-8 2019 for generalized fatigue, hand tremor, history of tic  exposure, serology came back positive for [facility] spotted fever titer, he was treated with IV doxycycline for 3 days, followed by p.o. 100 mg twice a day for 10 days,  His fatigue has improved, but his right hand tremor remained,  He denies a family history of dementia, Alzheimer's disease   CT head without contrast August 2019 that was normal  MRI lumbar in August 2019, moderate to severe central canal stenosis, narrowing of both subarticular recess at L4-5 with potential impingement on L5, broad-based central and right-sided disc protrusion at L5-S1 impingement on right S1 nerve roots,  Laboratory evaluations in August 2019,  BMP showed mild elevated glucose 121, showed mildly decreased hemoglobin of 11.3, HIV,  DATscan on January 05, 2019, decreased dopamine transporter activity in the posterior left striata, in a pattern consistent with Parkinson syndrome pathology,  He continues to have mild right hand resting tremor, denies significant right-sided weakness,  no gait abnormality, he has mild left arm weakness due to previous residual deficit from left cervical radiculopathy  UPDATE Nov 15 2019: He is here to follow-up for idiopathic Parkinson's disease, confirmed by abnormal DATscan in January 2020, he complains of increased right hand tremor, denies significant gait abnormalities, he takes care of his wife, who suffered stroke, also receiving dialysis,  UPDATE April 19 2020: Since last visit, he has tried Requip XR 2 mg every night, complains of an tolerable side effect, "make me like a zombie", later will try to Sinemet 25/100 mg 3 times daily, he tolerated the medication well, does help him move better, but only helped his right hand tremor mildly, he stopped taking the medication  He is a caregiver of his wife, who suffered stroke, total care, he denied depression, complains of difficulty sleeping, often experience right leg numbness, urge to move, also complains of bilateral feet paresthesia  UPDATE Jun 12 2020:  I reviewed the laboratory evaluation from his primary care Dr. Consuello Bowen on March 15, 2020, normal CMP, glucose was 141, creatinine is 1.05, A1c was 6.6, LDL was 57  Patient return for electrodiagnostic study today, which showed evidence of mild length dependent axonal sensorimotor polyneuropathy, most consistent with his diabetes, which is under good control, he has stopped his Metformin treatment,  Gabapentin 100 mg every night has helped his restless leg symptoms, he can sleep much better  He continues to be bothered by his on Parkinson features, almost constant right hand tremor, mild unsteady gait, he denies bowel and bladder incontinence  Today's electrodiagnostic study also demonstrate chronic left cervical radiculopathy, involving left C5, 6, 7 myotomes.  There was  increased insertional activity at left cervical paraspinal muscles, he did report a history of cervical injury many years ago, noticed gradual onset slow worsening  left arm weakness,  REVIEW OF SYSTEMS: Full 14 system review of systems performed and notable only for as above All other review of systems were negative.  ALLERGIES: Allergies  Allergen Reactions  . Lipitor [Atorvastatin] Other (See Comments)    Muscle aches- tolerates Zocor    HOME MEDICATIONS: Current Outpatient Medications  Medication Sig Dispense Refill  . aspirin 81 MG tablet Take 81 mg by mouth as needed for pain.    . carbidopa-levodopa (SINEMET IR) 25-100 MG tablet Take 1 tablet by mouth 3 (three) times daily. 90 tablet 11  . gabapentin (NEURONTIN) 100 MG capsule Take 3 capsules (300 mg total) by mouth at bedtime. 90 capsule 11  . lisinopril (PRINIVIL,ZESTRIL) 10 MG tablet Take 10 mg by mouth every morning.   12  . metoprolol succinate (TOPROL-XL) 25 MG 24 hr tablet TAKE 1 TABLET BY MOUTH EVERY DAY 30 tablet 6  . nitroGLYCERIN (NITROSTAT) 0.4 MG SL tablet Place 1 tablet (0.4 mg total) under the tongue every 5 (five) minutes as needed for chest pain. 25 tablet 3  . simvastatin (ZOCOR) 40 MG tablet TAKE 1 TABLET BY MOUTH DAILY AT 6PM 30 tablet 6   No current facility-administered medications for this visit.    PAST MEDICAL HISTORY: Past Medical History:  Diagnosis Date  . CAD (coronary artery disease)    a. 04/2015 Ant STEMI/PCI:  LM nl, LAD 100p w/ R->L Collats (3.5x23 Xience DES), LCX nl, RCA nl, EF 45%.  . Diabetes mellitus without complication ([place])   . Hypertension   . Renal disorder   . Tremor     PAST SURGICAL HISTORY: Past Surgical History:  Procedure Laterality Date  . CARDIAC CATHETERIZATION N/A 05/08/2015   Procedure: Left Heart Cath and Coronary Angiography;  Surgeon: Troy Sine, MD;  Location: [facility];  Service: Cardiovascular;  Laterality: N/A;  . PERCUTANEOUS CORONARY STENT INTERVENTION (PCI-S)  05/08/2015   Procedure: Percutaneous Coronary Stent Intervention (Pci-S);  Surgeon: Troy Sine, MD;  Location: [facility];  Service:  Cardiovascular;;    FAMILY HISTORY: Family History  Problem Relation Age of Onset  . Other Mother        healthy other than chronic pain  . Other Father        MVA caused death  . Heart attack Paternal Grandfather     SOCIAL HISTORY: Social History   Socioeconomic History  . Marital status: Married    Spouse name: Not on file  . Number of children: 3  . Years of education: 67  . Highest education level: High school graduate  Occupational History  . Not on file  Tobacco Use  . Smoking status: Never Smoker  . Smokeless tobacco: Never Used  Vaping Use  . Vaping Use: Never used  Substance and Sexual Activity  . Alcohol use: No    Alcohol/week: 0.0 standard drinks  . Drug use: Never  . Sexual activity: Not on file  Other Topics Concern  . Not on file  Social History Narrative   Lives at home with his wife.   Right-handed.   No daily caffeine use.   Social Determinants of Health   Financial Resource Strain:   . Difficulty of Paying Living Expenses:   Food Insecurity:   . Worried About Charity fundraiser in the Last Year:   .  Ran Out of Food in the Last Year:   Transportation Needs:   . Film/video editor (Medical):   Marland Kitchen Lack of Transportation (Non-Medical):   Physical Activity:   . Days of Exercise per Week:   . Minutes of Exercise per Session:   Stress:   . Feeling of Stress :   Social Connections:   . Frequency of Communication with Friends and Family:   . Frequency of Social Gatherings with Friends and Family:   . Attends Religious Services:   . Active Member of Clubs or Organizations:   . Attends Archivist Meetings:   Marland Kitchen Marital Status:   Intimate Partner Violence:   . Fear of Current or Ex-Partner:   . Emotionally Abused:   Marland Kitchen Physically Abused:   . Sexually Abused:      PHYSICAL EXAM   There were no vitals filed for this visit. Not recorded     There is no height or weight on file to calculate BMI.  PHYSICAL  EXAMNIATION:  Gen: NAD, conversant, well nourised, obese, well groomed                     Cardiovascular: Regular rate rhythm, no peripheral edema, warm, nontender. Eyes: Conjunctivae clear without exudates or hemorrhage Neck: Supple, no carotid bruits. Pulmonary: Clear to auscultation bilaterally   NEUROLOGICAL EXAM:  MENTAL STATUS: Speech:    Mildly microphonia; fluent and spontaneous with normal comprehension.  Cognition:     Orientation to time, place and person     Normal recent and remote memory     Normal Attention span and concentration     Normal Language, naming, repeating,spontaneous speech     Fund of knowledge   CRANIAL NERVES: CN II: Visual fields are full to confrontation.Pupils are round equal and briskly reactive to light. CN III, IV, VI: Mild limitation upper gaze CN V: Facial sensation is intact  CN VII: Face is symmetric with normal eye closure and smile. CN VIII: Hearing is normal to rubbing fingers CN IX, X: Palate elevates symmetrically. Phonation is normal. CN XI: Head turning and shoulder shrug are intact    MOTOR: He has right hand resting tremor, right more than left side rigidity, mild bradykinesia, mild to moderate left shoulder abduction, external rotation, elbow flexion, extension weakness, slight left finger abduction weakness, mild atrophy of left hand intrinsic hand muscles  REFLEXES: Reflexes are 1 and symmetric at the biceps, triceps, brisk at bilateral knees, and absent at ankles. Plantar responses are extensor bilaterally  SENSORY: Intact to light touch, COORDINATION: Rapid alternating movements and fine finger movements are intact. There is no dysmetria on finger-to-nose and heel-knee-shin.    GAIT/STANCE: He can get up from seated position arms crossed, mildly decreased right arm swing, moderate stride, smooth turning, mild retropulsion stability. Romberg is absent.   DIAGNOSTIC DATA (LABS, IMAGING, TESTING) - I reviewed patient  records, labs, notes, testing and imaging myself where available.   ASSESSMENT AND PLAN  Harry Bowen is a 69 y.o. male   Idiopathic Parkinson's disease  Datscan in January 2020 confirmed decreased dopamine transporter activity in the posterior left strata,  Emphasize important of moderate exercise  He could not tolerate Requip XR 2 mg due to side effect, " make me feel like [place]"  Continue Sinemet 25/100 mg 3 times daily, he reported mild improvement,   Restless legs symptoms  EMG nerve conduction study confirmed mild to moderate length dependent axonal sensorimotor polyneuropathy, laboratory evaluation so  far demonstrate diabetes, under good control, previously was treated with Metformin, without Metformin treatment, A1c was 6.6  May consider more extensive laboratory evaluation to rule out other treatable etiology, such as B12, TSH, protein electrophoresis to rule out paraproteinemia, ferritin, ANA, ESR,, C-reactive protein to rule out inflammatory process, he would prefer complete laboratory evaluation through his primary care physician  Gabapentin 100 mg every night has been helpful   Left cervical radiculopathy  Evidence of left proximal muscle weakness, significant chronic neuropathic changes involving left C5-C6-C7 myotomes, increased insertional activity at left cervical paraspinal muscles.  Brisk bilateral patellar reflex, bilateral Babinski signs,  MRI of cervical spine   Marcial Pacas, M.D. Ph.D.  [facility] [address], [place], [place] [address] Ph: [phone] Fax: [phone]  CC: Referring Provider

## 2020-06-12 NOTE — Procedures (Addendum)
Full Name: Harry Bowen Gender: Male MRN #: [id number] Date of Birth: [birth year]    Visit Date: 06/12/2020 12:34 Age: 69 Years Examining Physician: Levert Feinstein, MD  Referring Physician: Levert Feinstein, MD Height: 6 feet 1 inch History:   69 year old male, with history of diabetes, presenting with bilateral feet paresthesia, restless leg symptoms, he also had a history of neck injury, continue have neck pain, radiating discomfort through left upper extremity, left arm weakness  Summary of the tests:  Nerve conduction study: Right sural, superficial peroneal sensory responses were absent.  Right tibial, peroneal to EDB motor response showed significantly decreased CMAP amplitude.  Right ulnar sensory response showed mildly prolonged peak latency with mildly decreased snap amplitude.  Right ulnar motor responses were normal.  Right radial sensory response showed normal peak latency, with mildly decreased snap amplitude.  Electromyography:  Selected needle examination of left upper extremity muscles showed chronic neuropathic changes, mainly involving left C5, 6, 7 myotomes.  There is also evidence of increased insertional activity at left cervical paraspinal muscles, with polyphasic complex smaller motor unit potential  Selected needle examination of right lower extremity muscles showed no significant abnormalities, with exception of mildly enlarged motor unit potential with decreased recruitment noted at right abductor hallucis.  Conclusion: This is an abnormal study.  There is electrodiagnostic evidence of length dependent moderate axonal sensorimotor polyneuropathy.  In addition, there is evidence of chronic cervical radiculopathy involving the left C5-6-7 myotomes.    ------------------------------- Levert Feinstein, M.D. PhD  [facility] [address], [address] [place], [place] [address] Tel: [phone] Fax: [phone]  Verbal informed consent was obtained  from the patient, patient was informed of potential risk of procedure, including bruising, bleeding, hematoma formation, infection, muscle weakness, muscle pain, numbness, among others.        MNC    Nerve / Sites Muscle Latency Ref. Amplitude Ref. Rel Amp Segments Distance Velocity Ref. Area    ms ms mV mV %  cm m/s m/s mVms  R Ulnar - ADM     Wrist ADM 3.1 ?3.3 8.3 ?6.0 100 Wrist - ADM 7   37.0     B.Elbow ADM 7.7  8.5  102 B.Elbow - Wrist 23 50 ?49 32.4     A.Elbow ADM 9.7  8.6  101 A.Elbow - B.Elbow 10 49 ?49 32.8  R Peroneal - EDB     Ankle EDB 5.9 ?6.5 0.5 ?2.0 100 Ankle - EDB 9   3.4     Fib head EDB 14.8  0.5  92.8 Fib head - Ankle 31 35 ?44 1.9     Pop fossa EDB 17.6  0.5  101 Pop fossa - Fib head 10 35 ?44 3.2         Pop fossa - Ankle      R Tibial - AH     Ankle AH 6.9 ?5.8 0.7 ?4.0 100 Ankle - AH 9   3.2     Pop fossa AH 20.5  0.4  55.6 Pop fossa - Ankle 42 31 ?41 2.7           SNC    Nerve / Sites Rec. Site Peak Lat Ref.  Amp Ref. Segments Distance    ms ms V V  cm  R Radial - Anatomical snuff box (Forearm)     Forearm Wrist 2.9 ?2.9 9 ?15 Forearm - Wrist 10  R Sural - Ankle (Calf)     Calf  Ankle NR ?4.4 NR ?6 Calf - Ankle 14  R Superficial peroneal - Ankle     Lat leg Ankle NR ?4.4 NR ?6 Lat leg - Ankle 14  R Ulnar - Orthodromic, (Dig V, Mid palm)     Dig V Wrist 3.8 ?3.1 3 ?5 Dig V - Wrist 69             F  Wave    Nerve F Lat Ref.   ms ms  R Tibial - AH 74.7 ?56.0  R Ulnar - ADM 35.5 ?32.0         EMG Summary Table    Spontaneous MUAP Recruitment  Muscle IA Fib PSW Fasc Other Amp Dur. Poly Pattern  R. Tibialis anterior Normal None None None _______ Normal Normal Normal Normal  R. Tibialis posterior Normal None None None _______ Normal Normal Normal Normal  R. Peroneus longus Normal None None None _______ Normal Normal Normal Reduced  R. Gastrocnemius (Medial head) Normal None None None _______ Normal Normal Normal Normal  R. Abductor hallucis Normal  None None None _______ Normal Normal Normal Reduced  R. Vastus lateralis Normal None None None _______ Normal Normal Normal Normal  L. First dorsal interosseous Normal None None None _______ Normal Normal Normal  reduced  L. Pronator teres Normal None None None _______ Normal Normal Normal  reduced  L. Brachioradialis Normal None None None _______ Normal Normal Normal  reduced  L. Extensor digitorum communis Normal None None None _______ Normal Normal Normal  reduced  L. Deltoid Normal None None None _______  enlarged  enlarged complex  reduced  L. Triceps brachii Normal None None None _______  enlarged  enlarged complex reduced  R. First dorsal interosseous Normal None None None _______ Normal Normal complexl reducedl  R. Deltoid Normal None None None _______  enlarged  enlarged complex reduced  R. Biceps brachii Normal None None None _______  enlarged  enlarged complex reduced

## 2020-06-12 NOTE — Progress Notes (Addendum)
EMG/NCS report is under procedure tab 

## 2020-06-12 NOTE — Telephone Encounter (Signed)
open MRI  Medicare/aarp order faxed to [facility] they will reach out to the pt to schedule. They will reach out to the patient to schedule.

## 2020-06-15 NOTE — Telephone Encounter (Signed)
Schedule for 06/26/20 at [facility].

## 2020-06-28 ENCOUNTER — Telehealth: Payer: Self-pay | Admitting: Neurology

## 2020-06-28 DIAGNOSIS — M4802 Spinal stenosis, cervical region: Secondary | ICD-10-CM

## 2020-06-28 NOTE — Telephone Encounter (Signed)
Please call patient MRI of cervical spine on June 26, 2020 from [facility] showed severe degenerative changes, evidence of severe canal stenosis at C3-4, C4-5, C5-6, with evidence of spinal cord compression,  I would like to refer him to neurosurgical clinic, he should bring his MRI films when he sees neurosurgeon,  If he wants to review films with me before neurosurgical clinic, please give him a follow-up appointment

## 2020-06-28 NOTE — Addendum Note (Signed)
Addended by: Lilla Shook on: 06/28/2020 05:34 PM   Modules accepted: Orders

## 2020-06-28 NOTE — Telephone Encounter (Signed)
I have spoken with the patient and he verbalized understanding of his MRI cervical results. He is agreeable to the neurosurgery referral. He has copy of his MRI disc at home and verbalized understanding to take it with him to the appt.

## 2020-09-16 ENCOUNTER — Other Ambulatory Visit: Payer: Self-pay | Admitting: Cardiology

## 2020-10-11 ENCOUNTER — Other Ambulatory Visit: Payer: Self-pay | Admitting: Cardiology

## 2020-11-05 IMAGING — NM NM DATSCAN
4 series · 33 of 40 positions shown · non-contrast
Comparison: Head CT 08/02/2018

CLINICAL DATA: RIGHT hand tremors.  67-year-old male

EXAM:
NUCLEAR MEDICINE BRAIN IMAGING WITH SPECT  (DaTscan )
TECHNIQUE: SPECT images of the brain were obtained after intravenous injection
of radiopharmaceutical. 4 hour post injection imaging. Appropriate
positioning. 0.8 ml lugols solution administered in a.m
RADIOPHARMACEUTICALS:  4.7 millicuries I 123 Ioflupane

[Series 1: brain spect · 4.14mm/px · 5 of 120 frames shown]
[frame 11/120  full-range]
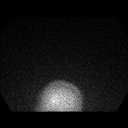
[frame 31/120  full-range]
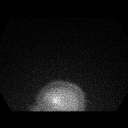
[frame 71/120  full-range]
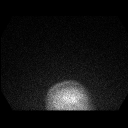
[frame 91/120  full-range]
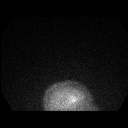
[frame 111/120  full-range]
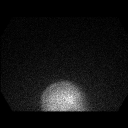

[Series 1: spect - (id) _(id)_tra · 4.1mm · 4.14mm/px · 5 of 128 frames shown]
[frame 11/128]
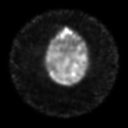
[frame 32/128]
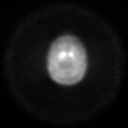
[frame 75/128]
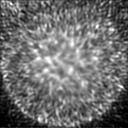
[frame 96/128]
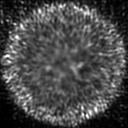
[frame 118/128]
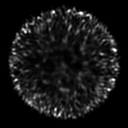

[Series 1: spect - (id) _(id)_cor · 4.1mm · 4.14mm/px · 5 of 128 frames shown]
[frame 11/128]
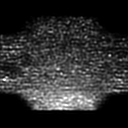
[frame 32/128]
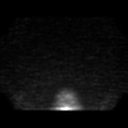
[frame 75/128]
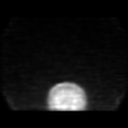
[frame 96/128]
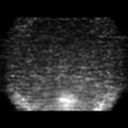
[frame 118/128]
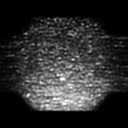

[Series 1016: mpr tra (id) range · 0.78mm/px · 18 of 40 slices shown]
[im 1/40]
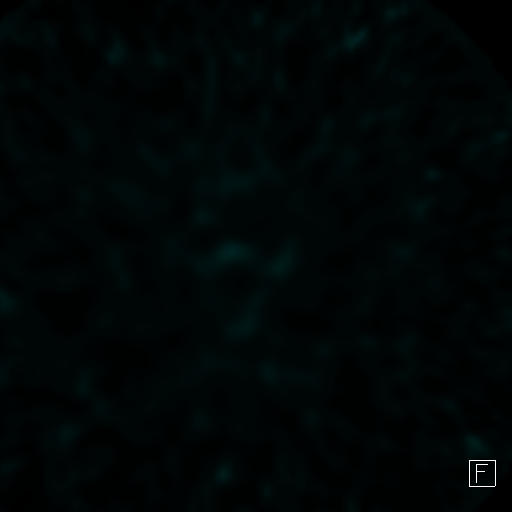
[im 4/40]
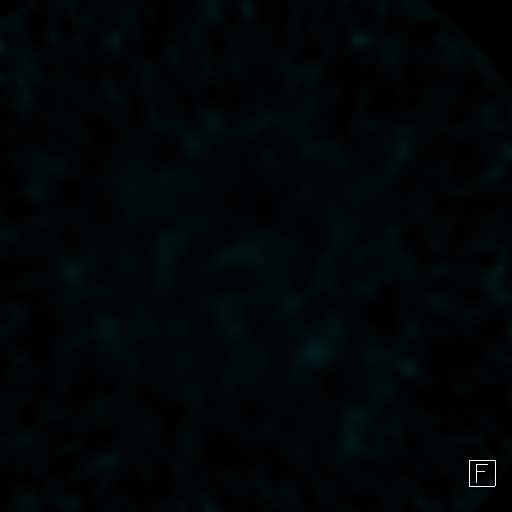
[im 6/40]
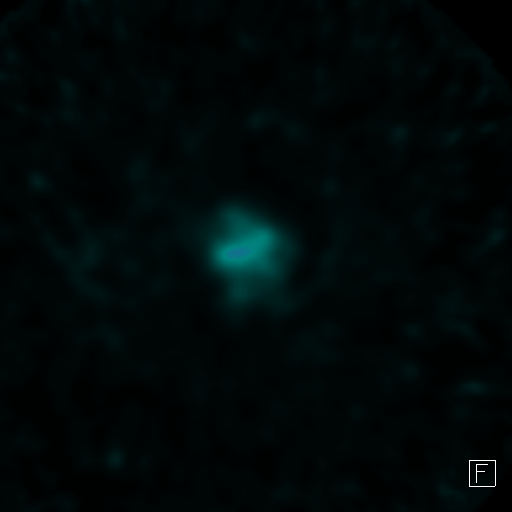
[im 8/40]
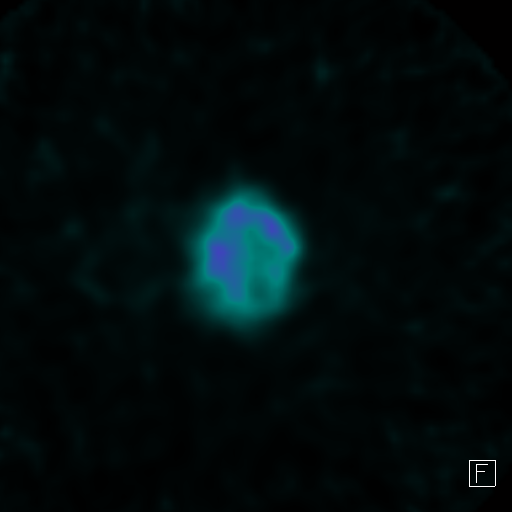
[im 10/40]
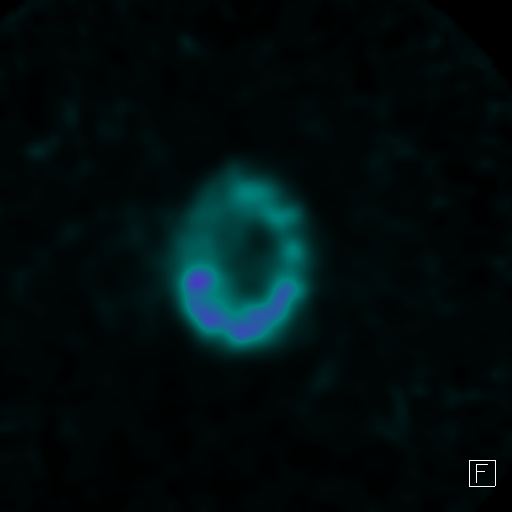
[im 12/40]
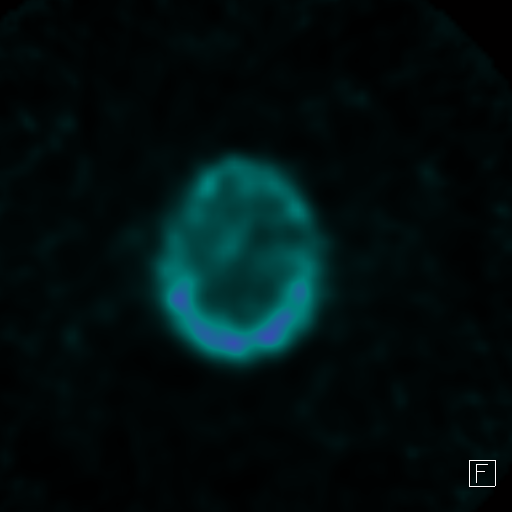
[im 15/40]
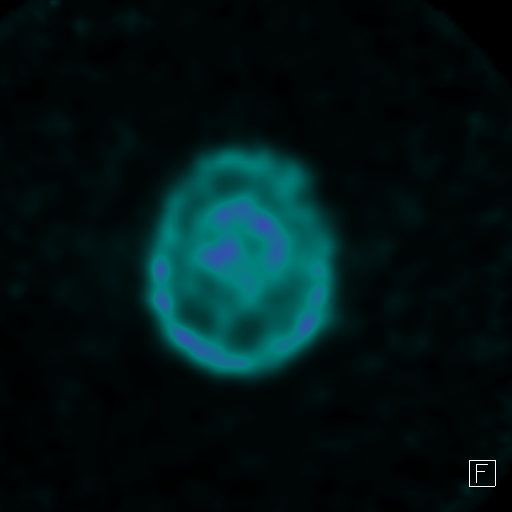
[im 17/40]
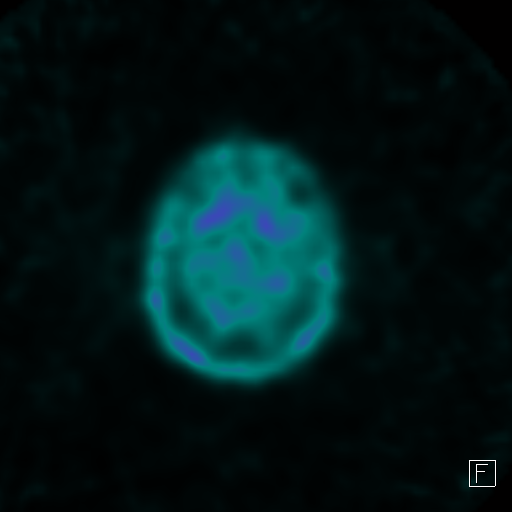
[im 19/40]
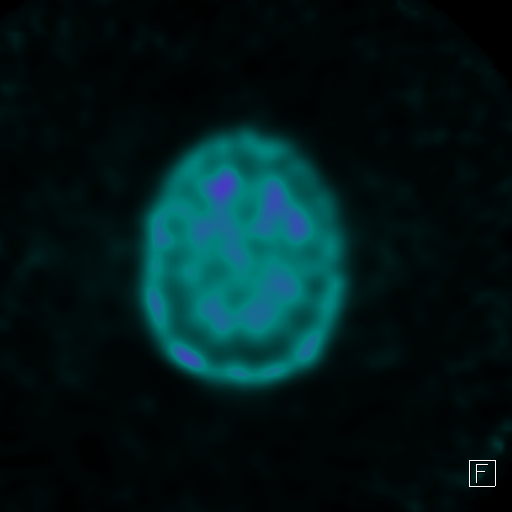
[im 21/40]
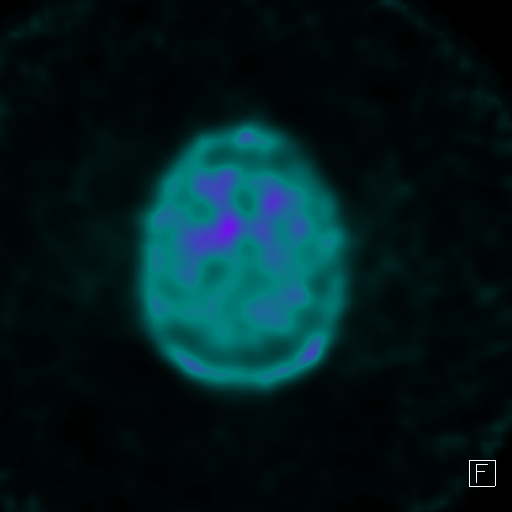
[im 23/40]
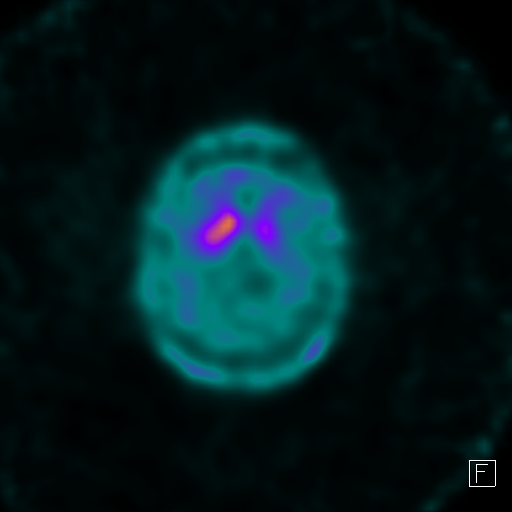
[im 27/40]
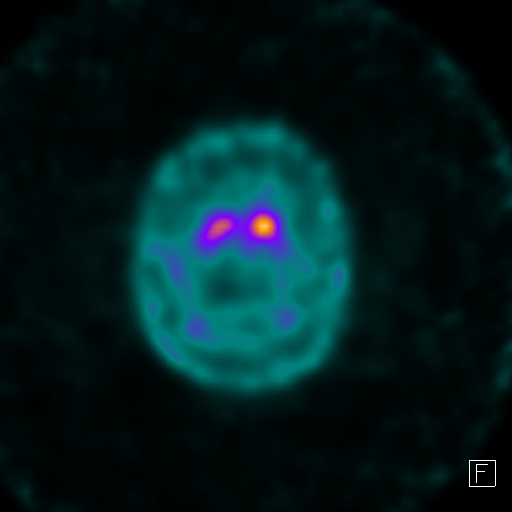
[im 28/40]
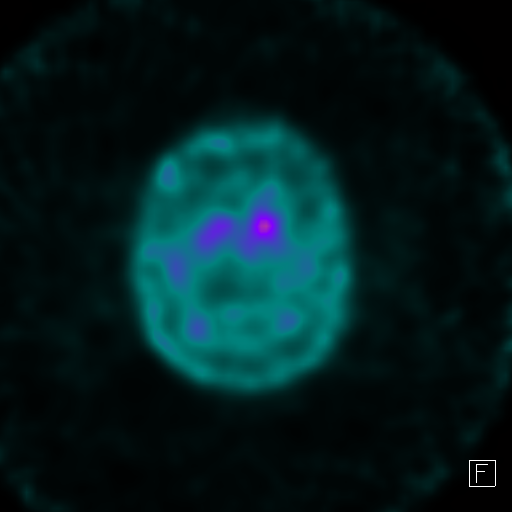
[im 30/40]
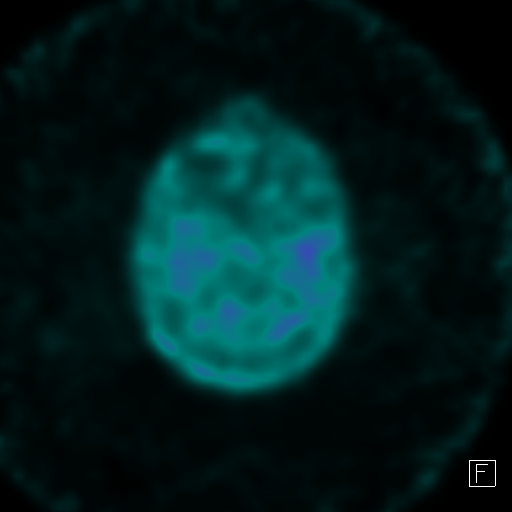
[im 32/40]
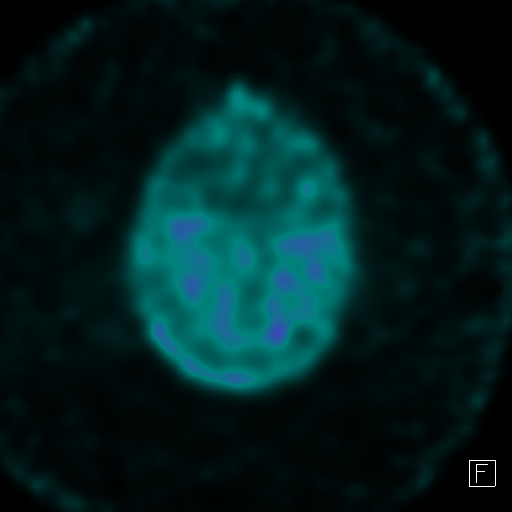
[im 34/40]
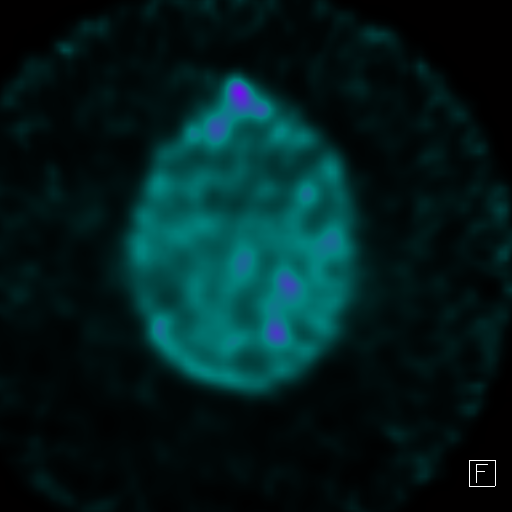
[im 38/40]
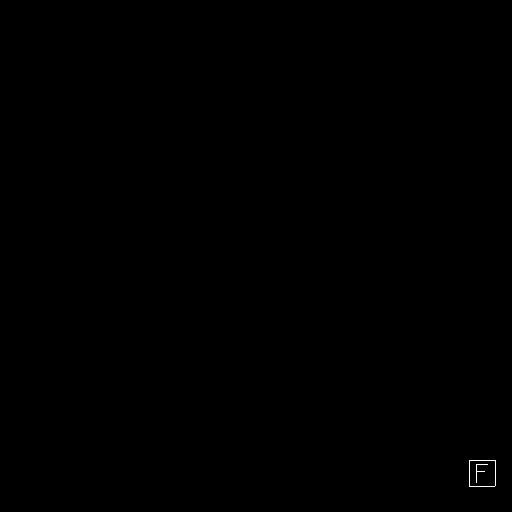
[im 40/40]
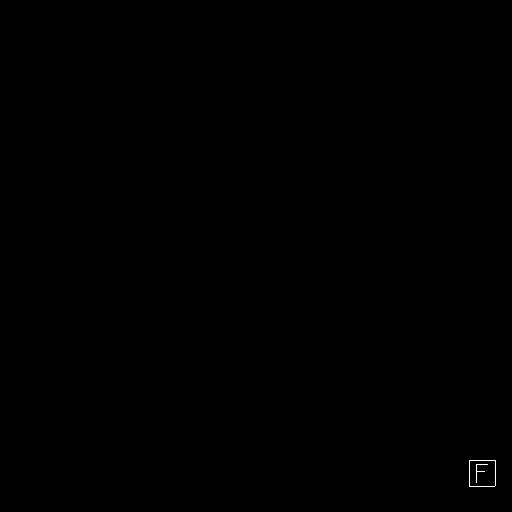

[33 of 40 positions shown; findings below may reference images not displayed]

FINDINGS: There is decreased activity within the posterior LEFT striata
(putamen). Normal uptake in the posterior RIGHT striata.Symmetric
uptake in the head of the caudate nuclei.
IMPRESSION: Decreased dopamine transporter activity in the posterior LEFT
striata is a pattern consistent with Parkinson's syndrome pathology.

## 2021-04-11 ENCOUNTER — Other Ambulatory Visit: Payer: Self-pay | Admitting: Cardiology

## 2021-04-25 ENCOUNTER — Ambulatory Visit: Payer: Medicare Other | Admitting: Neurology

## 2021-07-11 ENCOUNTER — Other Ambulatory Visit: Payer: Self-pay | Admitting: Neurology

## 2021-07-18 ENCOUNTER — Encounter: Payer: Self-pay | Admitting: Neurology

## 2021-07-18 ENCOUNTER — Ambulatory Visit (INDEPENDENT_AMBULATORY_CARE_PROVIDER_SITE_OTHER): Payer: Medicare Other | Admitting: Neurology

## 2021-07-18 VITALS — BP 137/71 | HR 74 | Ht 73.5 in | Wt 188.5 lb

## 2021-07-18 DIAGNOSIS — M5412 Radiculopathy, cervical region: Secondary | ICD-10-CM

## 2021-07-18 DIAGNOSIS — G20 Parkinson's disease: Secondary | ICD-10-CM | POA: Diagnosis not present

## 2021-07-18 MED ORDER — CARBIDOPA-LEVODOPA 25-100 MG PO TABS: 1.5000 | ORAL_TABLET | Freq: Three times a day (TID) | ORAL | 3 refills | Status: DC

## 2021-07-18 NOTE — Patient Instructions (Signed)
Try an increase of Sinemet 25/100 mg, 1.5 tablets 3 times daily Consider neurosurgery referral  See you back in 5-6 months

## 2021-07-18 NOTE — Progress Notes (Signed)
HISTORICAL  Harry Bowen is a 70 year old male, seen in request by his primary care physician Dr. [name] for evaluation of right hand tremor, initial evaluation was on October 15, 2018.  I have reviewed and summarized the referring note from the referring physician.  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  Since summer 2019, he noticed gradual onset right hand tremor, when holding the wheels, also noticed mild change in the way he walks, but denies significant difficulty,  he was admitted to the hospital from August to 5-8 2019 for generalized fatigue, hand tremor, history of tic  exposure, serology came back positive for [facility] spotted fever titer, he was treated with IV doxycycline for 3 days, followed by p.o. 100 mg twice a day for 10 days,  His fatigue has improved, but his right hand tremor remained,  He denies a family history of dementia, Alzheimer's disease   CT head without contrast August 2019 that was normal  MRI lumbar in August 2019, moderate to severe central canal stenosis, narrowing of both subarticular recess at L4-5 with potential impingement on L5, broad-based central and right-sided disc protrusion at L5-S1 impingement on right S1 nerve roots,  Laboratory evaluations in August 2019,  BMP showed mild elevated glucose 121, showed mildly decreased hemoglobin of 11.3, HIV,  DATscan on January 05, 2019, decreased dopamine transporter activity in the posterior left striata, in a pattern consistent with Parkinson syndrome pathology,  He continues to have mild right hand resting tremor, denies significant right-sided weakness,  no gait abnormality, he has mild left arm weakness due to previous residual deficit from left cervical radiculopathy  UPDATE Nov 15 2019: He is here to follow-up for idiopathic Parkinson's disease, confirmed by abnormal DATscan in January 2020, he complains of increased right hand tremor, denies significant gait abnormalities, he takes care of his wife, who suffered stroke, also receiving dialysis,  UPDATE April 19 2020: Since last visit, he has tried Requip XR 2 mg every night, complains of an tolerable side effect, "make me like a zombie", later will try to Sinemet 25/100 mg 3 times daily, he tolerated the medication well, does help him move better, but only helped his right hand tremor mildly, he stopped taking the medication  He is a caregiver of his wife, who suffered stroke, total care, he denied depression, complains of difficulty sleeping, often experience right leg numbness, urge to move, also complains of bilateral feet paresthesia  UPDATE Jun 12 2020:  I reviewed the laboratory evaluation from his primary care Dr. [name] on March 15, 2020, normal CMP, glucose was 141, creatinine is 1.05, A1c was 6.6, LDL was 57  Patient return for electrodiagnostic study today, which showed evidence of mild length dependent axonal sensorimotor polyneuropathy, most consistent with his diabetes, which is under good control, he has stopped his Metformin treatment,  Gabapentin 100 mg every night has helped his restless leg symptoms, he can sleep much better  He continues to be bothered by his on Parkinson features, almost constant right hand tremor, mild unsteady gait, he denies bowel and bladder incontinence  Today's electrodiagnostic study also demonstrate chronic left cervical radiculopathy, involving left C5, 6, 7 myotomes.  There was  increased insertional activity at left cervical paraspinal muscles, he did report a history of cervical injury many years ago, noticed gradual onset slow worsening  left arm weakness,  Update July 18, 2021 SS: Here today alone, he cancelled appointment with neurosurgery, isn't an option for surgery right now, given that he is caregiver for his wife, she paralyzed on her left side, has to pull and lift.   MRI cervical spine in June 2021 showed severe degenerative changes, evidence of severe canal stenosis at C3-4, C4-5, C5-6, with evidence of spinal cord compression, refer to neurosurgery. Does have left arm weakness.   Concerning right hand tremor, shakes all the time, No falls, but balance isn't great, especially when 1st standing. Tremor is embarrassing to him.  Takes gabapentin 100 mg at bedtime for leg pain, Sinemet 25/100 mg 3 times daily 7:30 AM, 12 PM, 8 PM. 30 minutes after taking the tremor calms down. No freezing episodes.   REVIEW OF SYSTEMS: Full 14 system review of systems performed and notable only for as above  See HPI  ALLERGIES: Allergies  Allergen Reactions   Lipitor [Atorvastatin] Other (See Comments)    Muscle aches- tolerates Zocor    HOME MEDICATIONS: Current Outpatient Medications  Medication Sig Dispense Refill   aspirin 81 MG tablet Take 81 mg by mouth as needed for pain.     carbidopa-levodopa (SINEMET IR) 25-100 MG tablet TAKE 1 TABLET BY MOUTH THREE TIMES DAILY 90 tablet 11   gabapentin (NEURONTIN) 100 MG capsule Take 100 mg by mouth 3 (three) times daily.     lisinopril (PRINIVIL,ZESTRIL) 10 MG tablet Take 10 mg by mouth every morning.   12   metoprolol succinate (TOPROL-XL) 25 MG 24 hr tablet TAKE 1 TABLET BY MOUTH EVERY DAY 90 tablet 1   nitroGLYCERIN (NITROSTAT) 0.4 MG SL tablet Place 1 tablet (0.4 mg total) under the tongue every 5 (five) minutes as needed for chest pain. 25 tablet 3   simvastatin (ZOCOR) 40 MG tablet TAKE 1 TABLET BY MOUTH DAILY AT 6PM 30 tablet 6   No current facility-administered medications for this visit.    PAST MEDICAL HISTORY: Past Medical History:  Diagnosis Date   CAD (coronary  artery disease)    a. 04/2015 Ant STEMI/PCI:  LM nl, LAD 100p w/ R->L Collats (3.5x23 Xience DES), LCX nl, RCA nl, EF 45%.   Diabetes mellitus without complication (HCC)    Hypertension    Renal disorder    Tremor     PAST SURGICAL HISTORY: Past Surgical History:  Procedure Laterality Date   CARDIAC CATHETERIZATION N/A 05/08/2015   Procedure: Left Heart Cath and Coronary Angiography;  Surgeon: Lennette Bihari, MD;  Location: [facility] INVASIVE CV LAB;  Service: Cardiovascular;  Laterality: N/A;   PERCUTANEOUS CORONARY STENT INTERVENTION (PCI-S)  05/08/2015   Procedure: Percutaneous Coronary Stent Intervention (Pci-S);  Surgeon: Lennette Bihari, MD;  Location: [facility] INVASIVE CV LAB;  Service: Cardiovascular;;    FAMILY HISTORY: Family History  Problem Relation Age of Onset   Other Mother        healthy other than chronic pain   Other Father        MVA caused death   Heart attack Paternal Grandfather     SOCIAL HISTORY: Social History   Socioeconomic History   Marital status: Married    Spouse name: Not on file   Number of children: 3   Years of education: 12   Highest education level: High school graduate  Occupational  History   Not on file  Tobacco Use   Smoking status: Never   Smokeless tobacco: Never  Vaping Use   Vaping Use: Never used  Substance and Sexual Activity   Alcohol use: No    Alcohol/week: 0.0 standard drinks   Drug use: Never   Sexual activity: Not on file  Other Topics Concern   Not on file  Social History Narrative   Lives at home with his wife and oldest son   Right-handed.   Drinks 6-7 cups caffeine daily   Social Determinants of Health   Financial Resource Strain: Not on file  Food Insecurity: Not on file  Transportation Needs: Not on file  Physical Activity: Not on file  Stress: Not on file  Social Connections: Not on file  Intimate Partner Violence: Not on file   PHYSICAL EXAM   Vitals:   07/18/21 1102  BP: 137/71  Pulse: 74  Weight: 188  lb 8 oz (85.5 kg)  Height: 6' 1.5" (1.867 m)   Not recorded    Body mass index is 24.53 kg/m.  Physical Exam  General: The patient is alert and cooperative at the time of the examination. Mild masking of the face is seen.   Skin: No significant peripheral edema is noted.  Neurologic Exam  Mental status: The patient is alert and oriented x 3 at the time of the examination. The patient has apparent normal recent and remote memory, with an apparently normal attention span and concentration ability.  Cranial nerves: Facial symmetry is present. Speech is normal, no aphasia or dysarthria is noted, soft voice. Extraocular movements are full. Visual fields are full.  Motor: The patient has good strength in all 4 extremities, but mild left arm weakness.  Resting tremor to right hand noted.  Right more than the left side rigidity.  No significant bradykinesia  Sensory examination: Soft touch sensation is symmetric on the face, arms, and legs.  Coordination: The patient has good finger-nose-finger and heel-to-shin bilaterally.  Gait and station: Able to stand from seated position with arms crossed, decreased arm swing on the right, right hand tremor noted, moderate stride, smooth turns  Reflexes: Deep tendon reflexes are symmetric, but increased at the knees  DIAGNOSTIC DATA (LABS, IMAGING, TESTING) - I reviewed patient records, labs, notes, testing and imaging myself where available.  ASSESSMENT AND PLAN  Harry Bowen is a 70 y.o. male   Idiopathic Parkinson's disease  Datscan in January 2020 confirmed decreased dopamine transporter activity in the posterior left strata,  Emphasize important of moderate exercise  He could not tolerate Requip XR 2 mg due to side effect  Will increase Sinemet 25/100 mg, 1-1/2 tablets 3 times daily, wants to try increase due to worsening tremor, will pay attention to gait stability, ease of movement, if no change will decrease back to 1 tablet 3 times  daily  Restless legs symptoms  EMG nerve conduction study confirmed mild to moderate length dependent axonal sensorimotor polyneuropathy, laboratory evaluation so far demonstrate diabetes, under good control, previously was treated with Metformin, without Metformin treatment, A1c was 6.6  Gabapentin 100 mg every night has been helpful   Left cervical radiculopathy -MRI of cervical spine on June 26, 2020 from [facility] showed severe degenerative changes, evidence of severe canal stenosis at C3-4, C4-5, C5-6, with evidence of spinal cord compression, -Evidence of left proximal muscle weakness, significant chronic neuropathic changes involving left C5-C6-C7 myotomes, increased insertional activity at left cervical paraspinal muscles.  Brisk bilateral  patellar reflex, bilateral Babinski signs -Referred to neurosurgery, but has held off on going, is not an option for him to have surgery right now, he is a caregiver for his wife, we reviewed the MRI, discussed risk of not proceeding, right now is stable clinically Follow-up here in 5 to 6 months or sooner if needed with Dr. Terrace Arabia, see benefit of increase in Sinemet  I spent 32 minutes of face-to-face and non-face-to-face time with patient.  This included previsit chart review, lab review, study review, order entry, discussing medications, MRI findings, neurosurgery referral, management, and follow up.   Harry Kluver, DNP  [facility] [address] [place], [place] [address] [phone]

## 2021-09-11 NOTE — Progress Notes (Signed)
Chart reviewed, agree above plan ?

## 2021-09-24 DIAGNOSIS — Z20822 Contact with and (suspected) exposure to covid-19: Secondary | ICD-10-CM | POA: Diagnosis not present

## 2021-09-24 DIAGNOSIS — Z20828 Contact with and (suspected) exposure to other viral communicable diseases: Secondary | ICD-10-CM | POA: Diagnosis not present

## 2021-10-30 ENCOUNTER — Other Ambulatory Visit: Payer: Self-pay | Admitting: Family Medicine

## 2021-12-23 DIAGNOSIS — Z20828 Contact with and (suspected) exposure to other viral communicable diseases: Secondary | ICD-10-CM | POA: Diagnosis not present

## 2021-12-26 DIAGNOSIS — M531 Cervicobrachial syndrome: Secondary | ICD-10-CM | POA: Diagnosis not present

## 2021-12-26 DIAGNOSIS — M47812 Spondylosis without myelopathy or radiculopathy, cervical region: Secondary | ICD-10-CM | POA: Diagnosis not present

## 2021-12-26 DIAGNOSIS — M9901 Segmental and somatic dysfunction of cervical region: Secondary | ICD-10-CM | POA: Diagnosis not present

## 2021-12-31 DIAGNOSIS — M531 Cervicobrachial syndrome: Secondary | ICD-10-CM | POA: Diagnosis not present

## 2021-12-31 DIAGNOSIS — M9901 Segmental and somatic dysfunction of cervical region: Secondary | ICD-10-CM | POA: Diagnosis not present

## 2021-12-31 DIAGNOSIS — M47812 Spondylosis without myelopathy or radiculopathy, cervical region: Secondary | ICD-10-CM | POA: Diagnosis not present

## 2022-01-02 DIAGNOSIS — M9901 Segmental and somatic dysfunction of cervical region: Secondary | ICD-10-CM | POA: Diagnosis not present

## 2022-01-02 DIAGNOSIS — M47812 Spondylosis without myelopathy or radiculopathy, cervical region: Secondary | ICD-10-CM | POA: Diagnosis not present

## 2022-01-02 DIAGNOSIS — M531 Cervicobrachial syndrome: Secondary | ICD-10-CM | POA: Diagnosis not present

## 2022-01-07 ENCOUNTER — Ambulatory Visit (INDEPENDENT_AMBULATORY_CARE_PROVIDER_SITE_OTHER): Payer: Medicare Other | Admitting: Neurology

## 2022-01-07 ENCOUNTER — Encounter: Payer: Self-pay | Admitting: Neurology

## 2022-01-07 VITALS — BP 134/84 | HR 76 | Ht 73.5 in | Wt 189.0 lb

## 2022-01-07 DIAGNOSIS — M4802 Spinal stenosis, cervical region: Secondary | ICD-10-CM

## 2022-01-07 DIAGNOSIS — G20 Parkinson's disease: Secondary | ICD-10-CM | POA: Diagnosis not present

## 2022-01-07 NOTE — Progress Notes (Signed)
HISTORICAL  Harry Bowen is a 71 year old male, seen in request by his primary care physician Dr. Fara ChutePaul Sasser for evaluation of right hand tremor, initial evaluation was on October 15, 2018.  I have reviewed and summarized the referring note from the referring physician.  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  Since summer 2019, he noticed gradual onset right hand tremor, when holding the wheels, also noticed mild change in the way he walks, but denies significant difficulty,  he was admitted to the hospital from August to 5-8 2019 for generalized fatigue, hand tremor, history of tic  exposure, [facility], he was treated with IV doxycycline for 3 days, followed by p.o. 100 mg twice a day for 10 days,  His fatigue has improved, but his right hand tremor remained,  He denies a family history of dementia, Alzheimer's disease   CT head without contrast August 2019 that was normal  MRI lumbar in August 2019, moderate to severe central canal stenosis, narrowing of both subarticular recess at L4-5 with potential impingement on L5, broad-based central and right-sided disc protrusion at L5-S1 impingement on right S1 nerve roots,  Laboratory evaluations in August 2019,  BMP showed mild elevated glucose 121, showed mildly decreased hemoglobin of 11.3, HIV,  DATscan on January 05, 2019, decreased dopamine transporter activity in the posterior left striata, in a pattern consistent with Parkinson syndrome pathology,  He continues to have mild right hand resting tremor, denies significant right-sided weakness,  no gait abnormality, he has mild left arm weakness due to previous residual deficit from left cervical radiculopathy  UPDATE Nov 15 2019: He is here to follow-up for idiopathic Parkinson's disease, confirmed by abnormal DATscan in January 2020, he complains of increased right hand tremor, denies significant gait abnormalities, he takes care of his wife, who suffered stroke, also receiving dialysis,  UPDATE April 19 2020: Since last visit, he has tried Requip XR 2 mg every night, complains of an tolerable side effect, "make me like a zombie", later will try to Sinemet 25/100 mg 3 times daily, he tolerated the medication well, does help him move better, but only helped his right hand tremor mildly, he stopped taking the medication  He is a caregiver of his wife, who suffered stroke, total care, he denied depression, complains of difficulty sleeping, often experience right leg numbness, urge to move, also complains of bilateral feet paresthesia  UPDATE Jun 12 2020:  I reviewed the laboratory evaluation from his primary care Dr. Fara ChutePaul Sasser on March 15, 2020, normal CMP, glucose was 141, creatinine is 1.05, A1c was 6.6, LDL was 57  Patient return for electrodiagnostic study today, which showed evidence of mild length dependent axonal sensorimotor polyneuropathy, most consistent with his diabetes, which is under good control, he has stopped his Metformin treatment,  Gabapentin 100 mg every night has helped his restless leg symptoms, he can sleep much better  He continues to be bothered by his on Parkinson features, almost constant right hand tremor, mild unsteady gait, he denies bowel and bladder incontinence  Today's electrodiagnostic study also demonstrate chronic left cervical radiculopathy, involving left C5, 6, 7 myotomes.  There was  increased insertional activity at left cervical paraspinal muscles, he did report a history of cervical injury many years ago, noticed gradual onset slow worsening  left arm weakness,  Update July 18, 2021 SS: Here today alone, he cancelled appointment with neurosurgery, isn't an option for surgery right now, given that he is caregiver for his wife, she paralyzed on her left side, has to pull and lift.   MRI cervical spine in June 2021 showed severe degenerative changes, evidence of severe canal stenosis at C3-4, C4-5, C5-6, with evidence of spinal cord compression, refer to neurosurgery. Does have left arm weakness.   Concerning right hand tremor, shakes all the time, No falls, but balance isn't great, especially when 1st standing. Tremor is embarrassing to him.  Takes gabapentin 100 mg at bedtime for leg pain, Sinemet 25/100 mg 3 times daily 7:30 AM, 12 PM, 8 PM. 30 minutes after taking the tremor calms down. No freezing episodes.   REVIEW OF SYSTEMS: Full 14 system review of systems performed and notable only for as above  See HPI  ALLERGIES: Allergies  Allergen Reactions   Lipitor [Atorvastatin] Other (See Comments)    Muscle aches- tolerates Zocor    HOME MEDICATIONS: Current Outpatient Medications  Medication Sig Dispense Refill   aspirin 81 MG tablet Take 81 mg by mouth as needed for pain.     carbidopa-levodopa (SINEMET IR) 25-100 MG tablet Take 1.5 tablets by mouth 3 (three) times daily. 405 tablet 3   gabapentin (NEURONTIN) 100 MG capsule Take 100 mg by mouth 3 (three) times daily.     lisinopril (PRINIVIL,ZESTRIL) 10 MG tablet Take 10 mg by mouth every morning.   12   metoprolol succinate (TOPROL-XL) 25 MG 24 hr tablet TAKE 1 TABLET BY MOUTH EVERY DAY 90 tablet 1   nitroGLYCERIN (NITROSTAT) 0.4 MG SL tablet Place 1 tablet (0.4 mg total) under the tongue every 5 (five) minutes as needed for chest pain. 25 tablet 3   simvastatin (ZOCOR) 40 MG tablet TAKE 1 TABLET BY MOUTH DAILY AT 6PM 30 tablet 6   No current facility-administered medications for this visit.    PAST MEDICAL HISTORY: Past Medical History:  Diagnosis Date   CAD  (coronary artery disease)    a. 04/2015 Ant STEMI/PCI:  LM nl, LAD 100p w/ R->L Collats (3.5x23 Xience DES), LCX nl, RCA nl, EF 45%.   Diabetes mellitus without complication (HCC)    Hypertension    Renal disorder    Tremor     PAST SURGICAL HISTORY: Past Surgical History:  Procedure Laterality Date   CARDIAC CATHETERIZATION N/A 05/08/2015   Procedure: Left Heart Cath and Coronary Angiography;  Surgeon: Lennette Bihari, MD;  Location: [facility] INVASIVE CV LAB;  Service: Cardiovascular;  Laterality: N/A;   PERCUTANEOUS CORONARY STENT INTERVENTION (PCI-S)  05/08/2015   Procedure: Percutaneous Coronary Stent Intervention (Pci-S);  Surgeon: Lennette Bihari, MD;  Location: [facility] INVASIVE CV LAB;  Service: Cardiovascular;;    FAMILY HISTORY: Family History  Problem Relation Age of Onset   Other Mother        healthy other than chronic pain   Other Father        MVA caused death   Heart attack Paternal Grandfather     SOCIAL HISTORY: Social History   Socioeconomic History   Marital status: Married    Spouse name: Not on file   Number of children: 3   Years of education: 12   Highest education level: High school graduate  Occupational History   Not on file  Tobacco Use   Smoking status: Never   Smokeless tobacco: Never  Vaping Use   Vaping Use: Never used  Substance and Sexual Activity   Alcohol use: No    Alcohol/week: 0.0 standard drinks   Drug use: Never   Sexual activity: Not on file  Other Topics Concern   Not on file  Social History Narrative   Lives at home with his wife and oldest son   Right-handed.   Drinks 6-7 cups caffeine daily   Social Determinants of Health   Financial Resource Strain: Not on file  Food Insecurity: Not on file  Transportation Needs: Not on file  Physical Activity: Not on file  Stress: Not on file  Social Connections: Not on file  Intimate Partner Violence: Not on file   PHYSICAL EXAM   Vitals:   01/07/22 1104  BP: 134/84  Pulse: 76   Weight: 189 lb (85.7 kg)  Height: 6' 1.5" (1.867 m)   Not recorded    Body mass index is 24.6 kg/m.  Physical Exam  General: The patient is alert and cooperative at the time of the examination. Mild masking of the face is seen.   Skin: No significant peripheral edema is noted.  Neurologic Exam  Mental status: The patient is alert and oriented x 3 at the time of the examination. The patient has apparent normal recent and remote memory, with an apparently normal attention span and concentration ability.  Cranial nerves: Facial symmetry is present. Speech is normal, no aphasia or dysarthria is noted, soft voice. Extraocular movements are full. Visual fields are full.  Motor: The patient has good strength in all 4 extremities, but mild left arm weakness.  Resting tremor to right hand noted.  Right more than the left side rigidity.  No significant bradykinesia  Sensory examination: Soft touch sensation is symmetric on the face, arms, and legs.  Coordination: The patient has good finger-nose-finger and heel-to-shin bilaterally.  Gait and station: Able to stand from seated position with arms crossed, decreased arm swing on the right, right hand tremor noted, moderate stride, smooth turns  Reflexes: Deep tendon reflexes are symmetric, but increased at the knees  DIAGNOSTIC DATA (LABS, IMAGING, TESTING) - I reviewed patient records, labs, notes, testing and imaging myself where available.  ASSESSMENT AND PLAN  Harry CowperCurtis L Frank is a 71 y.o. male   Idiopathic Parkinson's disease  Datscan in January 2020 confirmed decreased dopamine transporter activity in the posterior left strata,  Emphasize important of moderate exercise  He could not tolerate Requip XR 2 mg due to side effect  Will increase Sinemet 25/100 mg, 1-1/2 tablets 3 times daily, wants to try increase due to worsening tremor, will pay attention to gait stability, ease of movement, if no change will decrease back to 1 tablet 3  times daily  Restless legs symptoms  EMG nerve conduction study confirmed mild to moderate length dependent axonal sensorimotor polyneuropathy, laboratory evaluation so far demonstrate diabetes, under good control, previously was treated with Metformin, without Metformin treatment, A1c was 6.6  Gabapentin 100 mg every night has been helpful   Left cervical radiculopathy -[facility] June [facility], [facility] showed severe degenerative changes, evidence of severe canal stenosis at C3-4, C4-5, C5-6, with evidence of spinal cord compression, -Evidence of left proximal muscle weakness, significant chronic neuropathic changes involving left C5-C6-C7 myotomes, increased insertional activity at left cervical paraspinal muscles.  Brisk bilateral patellar  reflex, bilateral Babinski signs -Referred to neurosurgery, but has held off on going, is not an option for him to have surgery right now, he is a caregiver for his wife, we reviewed the MRI, discussed risk of not proceeding, right now is stable clinically Follow-up here in 5 to 6 months or sooner if needed with Dr. Terrace Arabia, see benefit of increase in Sinemet  I spent 32 minutes of face-to-face and non-face-to-face time with patient.  This included previsit chart review, lab review, study review, order entry, discussing medications, MRI findings, neurosurgery referral, management, and follow up.   Otila Kluver, DNP  [facility] [address] [place], [place] [address] [phone]

## 2022-01-07 NOTE — Progress Notes (Signed)
ASSESSMENT AND PLAN  Harry Bowen is a 71 y.o. male   Idiopathic Parkinson's disease  Datscan in January 2020 confirmed decreased dopamine transporter activity in the posterior left strata,  Emphasize important of moderate exercise  He could not tolerate Requip XR 2 mg due to side effect  Will increase Sinemet 25/100 mg, 1-1/2 tablets 3 times daily,   Restless legs symptoms  EMG nerve conduction study confirmed mild to moderate length dependent axonal sensorimotor polyneuropathy, laboratory evaluation so far demonstrate diabetes, under good control, previously was treated with Metformin, without Metformin treatment, A1c was 6.6  Gabapentin 100 mg every night has been helpful   Left cervical radiculopathy -MRI of cervical spine on June 26, 2020 from [facility] showed severe degenerative changes, evidence of severe canal stenosis at C3-4, C4-5, C5-6, with evidence of spinal cord compression, -Evidence of left proximal muscle weakness, significant chronic neuropathic changes involving left C5-C6-C7 myotomes, increased insertional activity at left cervical paraspinal muscles.  Brisk bilateral patellar reflex, bilateral Babinski signs -Referred to neurosurgery, patient does not want to consider decompression surgery at this point, he is the main caregiver of his wife, who suffered stroke, need help in daily activity  DIAGNOSTIC DATA (LABS, IMAGING, TESTING) - I reviewed patient records, labs, notes, testing and imaging myself where available.  HISTORICAL  Harry Bowen is a 71 year old male, seen in request by his primary care physician Dr. Fara Chute for evaluation of right hand tremor, initial evaluation was on October 15, 2018.  I have reviewed and summarized the referring note from the referring physician.  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  Since summer 2019, he noticed gradual onset right hand tremor, when holding the wheels, also noticed mild change in the way he walks, but denies significant difficulty,  he was admitted to the hospital from August to 5-8 2019 for generalized fatigue, hand tremor, history of tic  exposure, serology came back positive for [facility] spotted fever titer, he was treated with IV doxycycline for 3 days, followed by p.o. 100 mg twice a day for 10 days,  His fatigue has improved, but his right hand tremor remained,  He denies a family history of dementia, Alzheimer's disease   CT head without contrast August 2019 that was normal  MRI lumbar in August 2019, moderate to severe central canal stenosis, narrowing of both subarticular recess at L4-5 with potential impingement on L5, broad-based central and right-sided disc protrusion at L5-S1 impingement on right S1 nerve roots,  Laboratory evaluations in August 2019,  BMP showed mild elevated glucose 121, showed mildly decreased hemoglobin of 11.3, HIV,  DATscan on January 05, 2019, decreased dopamine transporter activity in the posterior left striata, in a pattern consistent with Parkinson syndrome pathology,  He continues to have mild right hand resting tremor, denies significant right-sided weakness, no gait abnormality, he has mild left arm weakness due to previous residual deficit from left cervical radiculopathy  UPDATE Nov 15 2019: He is here to follow-up for idiopathic Parkinson's disease, confirmed by abnormal DATscan in January 2020, he complains of increased right hand tremor, denies significant gait abnormalities, he takes care of his wife, who suffered stroke, also receiving dialysis,  UPDATE April 19 2020: Since last visit, he has  tried Requip XR 2 mg every night, complains of an tolerable side effect, "make me like a zombie", later will try to Sinemet 25/100 mg 3 times daily, he tolerated the medication well, does help him move better, but only helped his right hand tremor mildly, he stopped taking the medication  He is a caregiver of his wife, who suffered stroke, total care, he denied depression, complains of difficulty sleeping, often experience right leg numbness, urge to move, also complains of bilateral feet paresthesia  UPDATE Jun 12 2020:  I reviewed the laboratory evaluation from his primary care Dr. Fara Chute on March 15, 2020, normal CMP, glucose was 141, creatinine is 1.05, A1c was 6.6, LDL was 57  Patient return for electrodiagnostic study today, which showed evidence of mild length dependent axonal sensorimotor polyneuropathy, most consistent with his diabetes, which is under good control, he has stopped his Metformin treatment,  Gabapentin 100 mg every night has helped his restless leg symptoms, he can sleep much better  He continues to be bothered by his on Parkinson features, almost constant right hand tremor, mild unsteady gait, he denies bowel and bladder incontinence  Today's electrodiagnostic study also demonstrate chronic left cervical radiculopathy, involving left C5, 6, 7 myotomes.  There was increased insertional activity at left cervical paraspinal muscles, he did report a history of cervical injury many years ago, noticed gradual onset slow worsening left arm weakness,  UPDATE Nov 07 2022: He is overall doing well, taking Sinemet 25/100 mg 1 tablet 3 times a day, noticed mild increased right hand stiffness at the end of the dose, also concerned about his right hand tremor, his main caregiver of his wife, who suffered a stroke,  He is also taking gabapentin 100 mg every night, which has helped him sleep better,  He denies significant memory loss,   PHYSICAL EXAM   Vitals:   01/07/22 1104   BP: 134/84  Pulse: 76  Weight: 189 lb (85.7 kg)  Height: 6' 1.5" (1.867 m)    Body mass index is 24.6 kg/m.  Physical Exam  General: The patient is alert and cooperative at the time of the examination. Mild masking of the face is seen.   Skin: No significant peripheral edema is noted.  Neurologic Exam  Mental status: Alert oriented to history taking and casual conversation  Cranial nerves: Facial symmetry is present. Speech is normal, no aphasia or dysarthria is noted, soft voice. Extraocular movements are full. Visual fields are full.  Motor: Right hand resting tremor, mild right more than left rigidity, bradykinesia  Sensory examination: Soft touch sensation is symmetric on the face, arms, and legs.  Coordination: The patient has good finger-nose-finger and heel-to-shin bilaterally.  Gait and station: Able to stand from seated position with arms crossed, decreased arm swing on the right, right hand tremor noted, moderate stride, smooth turns  Reflexes: Deep tendon reflexes are symmetric, but increased at the knees  REVIEW OF SYSTEMS: Full 14 system review of systems performed and notable only for as above  See HPI  ALLERGIES: Allergies  Allergen Reactions   Lipitor [Atorvastatin] Other (See Comments)    Muscle aches- tolerates Zocor    HOME MEDICATIONS: Current Outpatient Medications  Medication Sig Dispense Refill   aspirin 81 MG tablet Take 81 mg by mouth as needed for pain.     carbidopa-levodopa (SINEMET IR) 25-100 MG tablet Take 1.5 tablets by mouth 3 (three) times daily. 405 tablet  3   gabapentin (NEURONTIN) 100 MG capsule Take 100 mg by mouth 3 (three) times daily.     lisinopril (PRINIVIL,ZESTRIL) 10 MG tablet Take 10 mg by mouth every morning.   12   metoprolol succinate (TOPROL-XL) 25 MG 24 hr tablet TAKE 1 TABLET BY MOUTH EVERY DAY 90 tablet 1   nitroGLYCERIN (NITROSTAT) 0.4 MG SL tablet Place 1 tablet (0.4 mg total) under the tongue every 5 (five)  minutes as needed for chest pain. 25 tablet 3   simvastatin (ZOCOR) 40 MG tablet TAKE 1 TABLET BY MOUTH DAILY AT 6PM 30 tablet 6   No current facility-administered medications for this visit.    PAST MEDICAL HISTORY: Past Medical History:  Diagnosis Date   CAD (coronary artery disease)    a. 04/2015 Ant STEMI/PCI:  LM nl, LAD 100p w/ R->L Collats (3.5x23 Xience DES), LCX nl, RCA nl, EF 45%.   Diabetes mellitus without complication (HCC)    Hypertension    Renal disorder    Tremor     PAST SURGICAL HISTORY: Past Surgical History:  Procedure Laterality Date   CARDIAC CATHETERIZATION N/A 05/08/2015   Procedure: Left Heart Cath and Coronary Angiography;  Surgeon: Lennette Biharihomas A Kelly, MD;  Location: [facility] INVASIVE CV LAB;  Service: Cardiovascular;  Laterality: N/A;   PERCUTANEOUS CORONARY STENT INTERVENTION (PCI-S)  05/08/2015   Procedure: Percutaneous Coronary Stent Intervention (Pci-S);  Surgeon: Lennette Biharihomas A Kelly, MD;  [facility];  Service: Cardiovascular;;    FAMILY HISTORY: Family History  Problem Relation Age of Onset   Other Mother        healthy other than chronic pain   Other Father        MVA caused death   Heart attack Paternal Grandfather     SOCIAL HISTORY: Social History   Socioeconomic History   Marital status: Married    Spouse name: Not on file   Number of children: 3   Years of education: 12   Highest education level: High school graduate  Occupational History   Not on file  Tobacco Use   Smoking status: Never   Smokeless tobacco: Never  Vaping Use   Vaping Use: Never used  Substance and Sexual Activity   Alcohol use: No    Alcohol/week: 0.0 standard drinks   Drug use: Never   Sexual activity: Not on file  Other Topics Concern   Not on file  Social History Narrative   Lives at home with his wife and oldest son   Right-handed.   Drinks 6-7 cups caffeine daily   Social Determinants of Health   Financial Resource Strain: Not on file   Food Insecurity: Not on file  Transportation Needs: Not on file  Physical Activity: Not on file  Stress: Not on file  Social Connections: Not on file  Intimate Partner Violence: Not on file    Levert FeinsteinYijun Lionel Woodberry, M.D. Ph.D.  [facility] [address] [place], [place] [address] Phone: [phone] Fax:      [phone]

## 2022-01-13 DIAGNOSIS — Z20822 Contact with and (suspected) exposure to covid-19: Secondary | ICD-10-CM | POA: Diagnosis not present

## 2022-01-14 DIAGNOSIS — M9901 Segmental and somatic dysfunction of cervical region: Secondary | ICD-10-CM | POA: Diagnosis not present

## 2022-01-14 DIAGNOSIS — M47812 Spondylosis without myelopathy or radiculopathy, cervical region: Secondary | ICD-10-CM | POA: Diagnosis not present

## 2022-01-14 DIAGNOSIS — M531 Cervicobrachial syndrome: Secondary | ICD-10-CM | POA: Diagnosis not present

## 2022-01-16 DIAGNOSIS — M9901 Segmental and somatic dysfunction of cervical region: Secondary | ICD-10-CM | POA: Diagnosis not present

## 2022-01-16 DIAGNOSIS — M531 Cervicobrachial syndrome: Secondary | ICD-10-CM | POA: Diagnosis not present

## 2022-01-16 DIAGNOSIS — M47812 Spondylosis without myelopathy or radiculopathy, cervical region: Secondary | ICD-10-CM | POA: Diagnosis not present

## 2022-01-21 DIAGNOSIS — M9901 Segmental and somatic dysfunction of cervical region: Secondary | ICD-10-CM | POA: Diagnosis not present

## 2022-01-21 DIAGNOSIS — M47812 Spondylosis without myelopathy or radiculopathy, cervical region: Secondary | ICD-10-CM | POA: Diagnosis not present

## 2022-01-21 DIAGNOSIS — M531 Cervicobrachial syndrome: Secondary | ICD-10-CM | POA: Diagnosis not present

## 2022-01-28 DIAGNOSIS — M531 Cervicobrachial syndrome: Secondary | ICD-10-CM | POA: Diagnosis not present

## 2022-01-28 DIAGNOSIS — M9901 Segmental and somatic dysfunction of cervical region: Secondary | ICD-10-CM | POA: Diagnosis not present

## 2022-01-28 DIAGNOSIS — M47812 Spondylosis without myelopathy or radiculopathy, cervical region: Secondary | ICD-10-CM | POA: Diagnosis not present

## 2022-02-04 DIAGNOSIS — M9901 Segmental and somatic dysfunction of cervical region: Secondary | ICD-10-CM | POA: Diagnosis not present

## 2022-02-04 DIAGNOSIS — M531 Cervicobrachial syndrome: Secondary | ICD-10-CM | POA: Diagnosis not present

## 2022-02-04 DIAGNOSIS — M47812 Spondylosis without myelopathy or radiculopathy, cervical region: Secondary | ICD-10-CM | POA: Diagnosis not present

## 2022-02-06 DIAGNOSIS — M531 Cervicobrachial syndrome: Secondary | ICD-10-CM | POA: Diagnosis not present

## 2022-02-06 DIAGNOSIS — M9901 Segmental and somatic dysfunction of cervical region: Secondary | ICD-10-CM | POA: Diagnosis not present

## 2022-02-06 DIAGNOSIS — M47812 Spondylosis without myelopathy or radiculopathy, cervical region: Secondary | ICD-10-CM | POA: Diagnosis not present

## 2022-02-07 ENCOUNTER — Other Ambulatory Visit: Payer: Self-pay | Admitting: Cardiology

## 2022-02-11 DIAGNOSIS — M531 Cervicobrachial syndrome: Secondary | ICD-10-CM | POA: Diagnosis not present

## 2022-02-11 DIAGNOSIS — M47812 Spondylosis without myelopathy or radiculopathy, cervical region: Secondary | ICD-10-CM | POA: Diagnosis not present

## 2022-02-11 DIAGNOSIS — M9901 Segmental and somatic dysfunction of cervical region: Secondary | ICD-10-CM | POA: Diagnosis not present

## 2022-02-13 DIAGNOSIS — M531 Cervicobrachial syndrome: Secondary | ICD-10-CM | POA: Diagnosis not present

## 2022-02-13 DIAGNOSIS — M9901 Segmental and somatic dysfunction of cervical region: Secondary | ICD-10-CM | POA: Diagnosis not present

## 2022-02-13 DIAGNOSIS — M47812 Spondylosis without myelopathy or radiculopathy, cervical region: Secondary | ICD-10-CM | POA: Diagnosis not present

## 2022-02-18 DIAGNOSIS — M47812 Spondylosis without myelopathy or radiculopathy, cervical region: Secondary | ICD-10-CM | POA: Diagnosis not present

## 2022-02-18 DIAGNOSIS — M9901 Segmental and somatic dysfunction of cervical region: Secondary | ICD-10-CM | POA: Diagnosis not present

## 2022-02-18 DIAGNOSIS — M531 Cervicobrachial syndrome: Secondary | ICD-10-CM | POA: Diagnosis not present

## 2022-02-20 DIAGNOSIS — M9901 Segmental and somatic dysfunction of cervical region: Secondary | ICD-10-CM | POA: Diagnosis not present

## 2022-02-20 DIAGNOSIS — M47812 Spondylosis without myelopathy or radiculopathy, cervical region: Secondary | ICD-10-CM | POA: Diagnosis not present

## 2022-02-20 DIAGNOSIS — M531 Cervicobrachial syndrome: Secondary | ICD-10-CM | POA: Diagnosis not present

## 2022-02-25 DIAGNOSIS — M531 Cervicobrachial syndrome: Secondary | ICD-10-CM | POA: Diagnosis not present

## 2022-02-25 DIAGNOSIS — M9901 Segmental and somatic dysfunction of cervical region: Secondary | ICD-10-CM | POA: Diagnosis not present

## 2022-02-25 DIAGNOSIS — M47812 Spondylosis without myelopathy or radiculopathy, cervical region: Secondary | ICD-10-CM | POA: Diagnosis not present

## 2022-03-11 DIAGNOSIS — M47812 Spondylosis without myelopathy or radiculopathy, cervical region: Secondary | ICD-10-CM | POA: Diagnosis not present

## 2022-03-11 DIAGNOSIS — M531 Cervicobrachial syndrome: Secondary | ICD-10-CM | POA: Diagnosis not present

## 2022-03-11 DIAGNOSIS — M9901 Segmental and somatic dysfunction of cervical region: Secondary | ICD-10-CM | POA: Diagnosis not present

## 2022-04-07 ENCOUNTER — Other Ambulatory Visit: Payer: Self-pay | Admitting: Student

## 2022-04-09 DIAGNOSIS — G8929 Other chronic pain: Secondary | ICD-10-CM | POA: Diagnosis not present

## 2022-04-09 DIAGNOSIS — M25579 Pain in unspecified ankle and joints of unspecified foot: Secondary | ICD-10-CM | POA: Diagnosis not present

## 2022-04-09 DIAGNOSIS — H2513 Age-related nuclear cataract, bilateral: Secondary | ICD-10-CM | POA: Diagnosis not present

## 2022-04-09 DIAGNOSIS — I10 Essential (primary) hypertension: Secondary | ICD-10-CM | POA: Diagnosis not present

## 2022-04-09 DIAGNOSIS — Z6824 Body mass index (BMI) 24.0-24.9, adult: Secondary | ICD-10-CM | POA: Diagnosis not present

## 2022-04-25 DIAGNOSIS — M19072 Primary osteoarthritis, left ankle and foot: Secondary | ICD-10-CM | POA: Diagnosis not present

## 2022-05-27 DIAGNOSIS — H25813 Combined forms of age-related cataract, bilateral: Secondary | ICD-10-CM | POA: Diagnosis not present

## 2022-05-27 DIAGNOSIS — H10811 Pingueculitis, right eye: Secondary | ICD-10-CM | POA: Diagnosis not present

## 2022-05-27 DIAGNOSIS — H524 Presbyopia: Secondary | ICD-10-CM | POA: Diagnosis not present

## 2022-05-27 DIAGNOSIS — H04123 Dry eye syndrome of bilateral lacrimal glands: Secondary | ICD-10-CM | POA: Diagnosis not present

## 2022-06-16 DIAGNOSIS — H25812 Combined forms of age-related cataract, left eye: Secondary | ICD-10-CM | POA: Diagnosis not present

## 2022-06-16 DIAGNOSIS — H25813 Combined forms of age-related cataract, bilateral: Secondary | ICD-10-CM | POA: Diagnosis not present

## 2022-07-07 ENCOUNTER — Other Ambulatory Visit: Payer: Self-pay | Admitting: *Deleted

## 2022-07-07 MED ORDER — SIMVASTATIN 40 MG PO TABS: 40.0000 mg | ORAL_TABLET | Freq: Every day | ORAL | 2 refills | Status: DC

## 2022-07-10 NOTE — Progress Notes (Signed)
Patient: Harry Bowen Date of Birth: November 08, 1951  Reason for Visit: Follow up for PD History from: Patient Primary Neurologist: Dr. Terrace Arabia   ASSESSMENT AND PLAN 71 y.o. year old male   1.  Idiopathic Parkinson's disease -Condition seems overall stable, his wife passed since last seen, seems to be doing fairly well with the adjustment -DaTscan in January 2020 confirmed decrease dopamine transporter activity in the posterior left strata -Could not tolerate Requip XR 2 mg -Continue Sinemet 25/100 mg 1.5 tablets 3 times a day -Encouraged to get back into Silver sneakers, was going 5 days a week  2.  Restless leg syndrome -EMG/NCV showed mild to moderate length dependent axonal sensorimotor polyneuropathy -On gabapentin 100 mg at bedtime  3.  Left cervical radiculopathy -MRI of cervical spine on June 26, 2020 from [facility] showed severe degenerative changes, evidence of severe canal stenosis at C3-4, C4-5, C5-6, with evidence of spinal cord compression, -Evidence of left proximal muscle weakness, significant chronic neuropathic changes involving left C5-C6-C7 myotomes, increased insertional activity at left cervical paraspinal muscles.  Brisk bilateral patellar reflex, bilateral Babinski signs -Has been referred to neurosurgery, he does not desire surgery at this point, encouraged exercise  HISTORY  Harry Bowen is a 71 year old male, seen in request by his primary care physician Dr. Fara Chute for evaluation of right hand tremor, initial evaluation was on October 15, 2018.   I have reviewed and summarized the referring note from the referring physician.  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   Since summer 2019, he noticed gradual onset right hand tremor, when holding the wheels, also noticed mild change in the way he walks, but denies significant difficulty,   he was admitted to the hospital from August to 5-8 2019 for generalized fatigue, hand tremor, history of tic  exposure, serology came back positive for [facility] spotted fever titer, he was treated with IV doxycycline for 3 days, followed by p.o. 100 mg twice a day for 10 days,   His fatigue has improved, but his right hand tremor remained,   He denies a family history of dementia, Alzheimer's disease    CT head without contrast August 2019 that was normal  MRI lumbar in August 2019, moderate to severe central canal stenosis, narrowing of both subarticular recess at L4-5 with potential impingement on L5, broad-based central and right-sided disc protrusion at L5-S1 impingement on right S1 nerve roots,   Laboratory evaluations in August 2019,  BMP showed mild elevated glucose 121, showed mildly decreased hemoglobin of 11.3, HIV,   DATscan on January 05, 2019, decreased dopamine transporter activity in the posterior left striata, in a pattern consistent with Parkinson syndrome pathology,   He continues to have mild right hand resting tremor, denies significant right-sided weakness, no gait abnormality, he has mild left arm weakness due to previous residual deficit from left cervical radiculopathy   UPDATE Nov 15 2019: He is here to follow-up for idiopathic Parkinson's disease, confirmed by abnormal DATscan in January 2020, he complains of increased right hand tremor, denies significant gait abnormalities, he takes care of his wife, who suffered stroke, also receiving dialysis,   UPDATE April 19 2020:  Since last visit, he has tried Requip XR 2 mg every night, complains of an tolerable side effect, "make me like a zombie", later will try to Sinemet 25/100 mg 3 times  daily, he tolerated the medication well, does help him move better, but only helped his right hand tremor mildly, he stopped taking the medication   He is a caregiver of his wife, who suffered stroke, total care, he denied depression, complains of difficulty sleeping, often experience right leg numbness, urge to move, also complains of bilateral feet paresthesia   UPDATE Jun 12 2020:  I reviewed the laboratory evaluation from his primary care Dr. Fara Chute on March 15, 2020, normal CMP, glucose was 141, creatinine is 1.05, A1c was 6.6, LDL was 57   Patient return for electrodiagnostic study today, which showed evidence of mild length dependent axonal sensorimotor polyneuropathy, most consistent with his diabetes, which is under good control, he has stopped his Metformin treatment,   Gabapentin 100 mg every night has helped his restless leg symptoms, he can sleep much better   He continues to be bothered by his on Parkinson features, almost constant right hand tremor, mild unsteady gait, he denies bowel and bladder incontinence   Today's electrodiagnostic study also demonstrate chronic left cervical radiculopathy, involving left C5, 6, 7 myotomes.  There was increased insertional activity at left cervical paraspinal muscles, he did report a history of cervical injury many years ago, noticed gradual onset slow worsening left arm weakness,   UPDATE Nov 07 2022: He is overall doing well, taking Sinemet 25/100 mg 1 tablet 3 times a day, noticed mild increased right hand stiffness at the end of the dose, also concerned about his right hand tremor, his main caregiver of his wife, who suffered a stroke,  He is also taking gabapentin 100 mg every night, which has helped him sleep better,   He denies significant memory loss,  Update July 15, 2022 SS: Increased Sinemet to 1.5 tablets 3 times daily in Jan 9 AM, 2 PM, 11 PM. Still tremor in hands, mostly in right, noticed chin quiver. Right hand has more  stiffness. Feels like speech is wet, can't get words out. He was doing silver sneakers, will get back once grand kids go back to school. He has a pool at his office. DM stable as far as he knows. Takes gabapentin 100 mg at bedtime, helps him relax at night for sleep. His wife passed in March 2023. No falls, or trouble chewing.     REVIEW OF SYSTEMS: Out of a complete 14 system review of symptoms, the patient complains only of the following symptoms, and all other reviewed systems are negative.  See HPI  ALLERGIES: Allergies  Allergen Reactions   Lipitor [Atorvastatin] Other (See Comments)    Muscle aches- tolerates Zocor    HOME MEDICATIONS: Outpatient Medications Prior to Visit  Medication Sig Dispense Refill   aspirin 81 MG tablet Take 81 mg by mouth as needed for pain.     lisinopril (PRINIVIL,ZESTRIL) 10 MG tablet Take 10 mg by mouth every morning.   12   metoprolol succinate (TOPROL-XL) 25 MG 24 hr tablet TAKE 1 TABLET BY MOUTH EVERY DAY - NEEDS OFFICE VISIT 30 tablet 0   nitroGLYCERIN (NITROSTAT) 0.4 MG SL tablet Place 1 tablet (0.4 mg total) under the tongue every 5 (five) minutes as needed for chest pain. 25 tablet 3   simvastatin (ZOCOR) 40 MG tablet Take 1 tablet (40 mg total) by mouth daily. 30  tablet 2   carbidopa-levodopa (SINEMET IR) 25-100 MG tablet Take 1.5 tablets by mouth 3 (three) times daily. 405 tablet 3   gabapentin (NEURONTIN) 100 MG capsule Take 100 mg by mouth 3 (three) times daily.     No facility-administered medications prior to visit.    PAST MEDICAL HISTORY: Past Medical History:  Diagnosis Date   CAD (coronary artery disease)    a. 04/2015 Ant STEMI/PCI:  LM nl, LAD 100p w/ R->L Collats (3.5x23 Xience DES), LCX nl, RCA nl, EF 45%.   Diabetes mellitus without complication (HCC)    Hypertension    Renal disorder    Tremor     PAST SURGICAL HISTORY: Past Surgical History:  Procedure Laterality Date   CARDIAC CATHETERIZATION N/A 05/08/2015    Procedure: Left Heart Cath and Coronary Angiography;  Surgeon: Lennette Bihari, MD;  Location: [facility] INVASIVE CV LAB;  Service: Cardiovascular;  Laterality: N/A;   PERCUTANEOUS CORONARY STENT INTERVENTION (PCI-S)  05/08/2015   Procedure: Percutaneous Coronary Stent Intervention (Pci-S);  Surgeon: Lennette Bihari, MD;  Location[facility] CV [facility];  Service: Cardiovascular;;    FAMILY HISTORY: Family History  Problem Relation Age of Onset   Other Mother        healthy other than chronic pain   Other Father        MVA caused death   Heart attack Paternal Grandfather     SOCIAL HISTORY: Social History   Socioeconomic History   Marital status: Married    Spouse name: Not on file   Number of children: 3   Years of education: 12   Highest education level: High school graduate  Occupational History   Not on file  Tobacco Use   Smoking status: Never   Smokeless tobacco: Never  Vaping Use   Vaping Use: Never used  Substance and Sexual Activity   Alcohol use: No    Alcohol/week: 0.0 standard drinks of alcohol   Drug use: Never   Sexual activity: Not on file  Other Topics Concern   Not on file  Social History Narrative   Lives at home with his wife and oldest son   Right-handed.   Drinks 6-7 cups caffeine daily   Social Determinants of Health   Financial Resource Strain: Not on file  Food Insecurity: Not on file  Transportation Needs: Not on file  Physical Activity: Not on file  Stress: Not on file  Social Connections: Not on file  Intimate Partner Violence: Not on file    PHYSICAL EXAM  Vitals:   07/15/22 1114  BP: (!) 148/82  Pulse: 81  Weight: 184 lb (83.5 kg)  Height: 6\' 1"  (1.854 m)   Body mass index is 24.28 kg/m.  Generalized: Well developed, in no acute distress  Neurological examination  Mentation: Alert oriented to time, place, history taking. Follows all commands speech and language fluent Cranial nerve II-XII: Pupils were equal round reactive to light.  Extraocular movements were full, visual field were full on confrontational test. Facial sensation and strength were normal.  Head turning and shoulder shrug  were normal and symmetric.  Mild masking of the face. Motor: Mild bradykinesia on the right, increased rigidity on the right, resting tremor right hand. Sensory: Sensory testing is intact to soft touch on all 4 extremities. No evidence of extinction is noted.  Coordination: Cerebellar testing reveals good finger-nose-finger and heel-to-shin bilaterally.  Gait and station: Able to stand from seated position with arms crossed, decreased arm swing on the  right, resting tremor, cautious turns, good pace, steady Reflexes: Deep tendon reflexes are symmetric and normal bilaterally.   DIAGNOSTIC DATA (LABS, IMAGING, TESTING) - I reviewed patient records, labs, notes, testing and imaging myself where available.  Lab Results  Component Value Date   WBC 3.0 (L) 08/04/2018   HGB 11.3 (L) 08/04/2018   HCT 34.7 (L) 08/04/2018   MCV 91.1 08/04/2018   PLT 126 (L) 08/04/2018      Component Value Date/Time   NA 138 08/05/2018 0446   K 3.8 08/05/2018 0446   CL 106 08/05/2018 0446   CO2 26 08/05/2018 0446   GLUCOSE 121 (H) 08/05/2018 0446   BUN 13 08/05/2018 0446   CREATININE 0.94 08/05/2018 0446   CALCIUM 8.1 (L) 08/05/2018 0446   PROT 6.5 08/03/2018 0415   ALBUMIN 3.5 08/03/2018 0415   AST 28 08/03/2018 0415   ALT 21 08/03/2018 0415   ALKPHOS 58 08/03/2018 0415   BILITOT 1.3 (H) 08/03/2018 0415   GFRNONAA >60 08/05/2018 0446   GFRAA >60 08/05/2018 0446   Lab Results  Component Value Date   CHOL 144 05/09/2015   HDL 36 (L) 05/09/2015   LDLCALC 91 05/09/2015   TRIG 87 05/09/2015   CHOLHDL 4.0 05/09/2015   Lab Results  Component Value Date   HGBA1C 7.3 (H) 05/08/2015   No results found for: "VITAMINB12" No results found for: "TSH"  Margie Ege, AGNP-C, DNP 07/15/2022, 12:20 PM [facility] [address], [address] [place], [place] [address] [phone]

## 2022-07-15 ENCOUNTER — Encounter: Payer: Self-pay | Admitting: Neurology

## 2022-07-15 ENCOUNTER — Ambulatory Visit (INDEPENDENT_AMBULATORY_CARE_PROVIDER_SITE_OTHER): Payer: Medicare Other | Admitting: Neurology

## 2022-07-15 VITALS — BP 148/82 | HR 81 | Ht 73.0 in | Wt 184.0 lb

## 2022-07-15 DIAGNOSIS — G20 Parkinson's disease: Secondary | ICD-10-CM | POA: Diagnosis not present

## 2022-07-15 DIAGNOSIS — M5412 Radiculopathy, cervical region: Secondary | ICD-10-CM

## 2022-07-15 DIAGNOSIS — G20C Parkinsonism, unspecified: Secondary | ICD-10-CM

## 2022-07-15 MED ORDER — GABAPENTIN 100 MG PO CAPS: 100.0000 mg | ORAL_CAPSULE | Freq: Three times a day (TID) | ORAL | 11 refills | Status: DC

## 2022-07-15 MED ORDER — CARBIDOPA-LEVODOPA 25-100 MG PO TABS: 1.5000 | ORAL_TABLET | Freq: Three times a day (TID) | ORAL | 3 refills | Status: DC

## 2022-07-15 NOTE — Patient Instructions (Signed)
Continue the Sinemet at current dosing Get back into your Silver Sneakers program  See you back in 6 months

## 2022-07-30 DIAGNOSIS — H25812 Combined forms of age-related cataract, left eye: Secondary | ICD-10-CM | POA: Diagnosis not present

## 2022-07-30 DIAGNOSIS — H268 Other specified cataract: Secondary | ICD-10-CM | POA: Diagnosis not present

## 2022-07-30 DIAGNOSIS — H52222 Regular astigmatism, left eye: Secondary | ICD-10-CM | POA: Diagnosis not present

## 2022-08-01 ENCOUNTER — Other Ambulatory Visit: Payer: Self-pay | Admitting: Cardiology

## 2022-08-06 DIAGNOSIS — H25811 Combined forms of age-related cataract, right eye: Secondary | ICD-10-CM | POA: Diagnosis not present

## 2022-08-13 DIAGNOSIS — H52221 Regular astigmatism, right eye: Secondary | ICD-10-CM | POA: Diagnosis not present

## 2022-08-13 DIAGNOSIS — H268 Other specified cataract: Secondary | ICD-10-CM | POA: Diagnosis not present

## 2022-08-13 DIAGNOSIS — H25811 Combined forms of age-related cataract, right eye: Secondary | ICD-10-CM | POA: Diagnosis not present

## 2022-11-26 DIAGNOSIS — E7801 Familial hypercholesterolemia: Secondary | ICD-10-CM | POA: Diagnosis not present

## 2022-11-26 DIAGNOSIS — E7849 Other hyperlipidemia: Secondary | ICD-10-CM | POA: Diagnosis not present

## 2022-11-26 DIAGNOSIS — E1142 Type 2 diabetes mellitus with diabetic polyneuropathy: Secondary | ICD-10-CM | POA: Diagnosis not present

## 2022-12-01 DIAGNOSIS — H11153 Pinguecula, bilateral: Secondary | ICD-10-CM | POA: Diagnosis not present

## 2022-12-01 DIAGNOSIS — H18513 Endothelial corneal dystrophy, bilateral: Secondary | ICD-10-CM | POA: Diagnosis not present

## 2022-12-01 DIAGNOSIS — H26491 Other secondary cataract, right eye: Secondary | ICD-10-CM | POA: Diagnosis not present

## 2022-12-01 DIAGNOSIS — H43813 Vitreous degeneration, bilateral: Secondary | ICD-10-CM | POA: Diagnosis not present

## 2022-12-03 DIAGNOSIS — Z20828 Contact with and (suspected) exposure to other viral communicable diseases: Secondary | ICD-10-CM | POA: Diagnosis not present

## 2022-12-03 DIAGNOSIS — E7849 Other hyperlipidemia: Secondary | ICD-10-CM | POA: Diagnosis not present

## 2022-12-03 DIAGNOSIS — Z0001 Encounter for general adult medical examination with abnormal findings: Secondary | ICD-10-CM | POA: Diagnosis not present

## 2022-12-03 DIAGNOSIS — I10 Essential (primary) hypertension: Secondary | ICD-10-CM | POA: Diagnosis not present

## 2022-12-03 DIAGNOSIS — Z23 Encounter for immunization: Secondary | ICD-10-CM | POA: Diagnosis not present

## 2022-12-03 DIAGNOSIS — R059 Cough, unspecified: Secondary | ICD-10-CM | POA: Diagnosis not present

## 2022-12-03 DIAGNOSIS — Z1389 Encounter for screening for other disorder: Secondary | ICD-10-CM | POA: Diagnosis not present

## 2022-12-03 DIAGNOSIS — Z1331 Encounter for screening for depression: Secondary | ICD-10-CM | POA: Diagnosis not present

## 2022-12-03 DIAGNOSIS — G629 Polyneuropathy, unspecified: Secondary | ICD-10-CM | POA: Diagnosis not present

## 2022-12-03 DIAGNOSIS — E1142 Type 2 diabetes mellitus with diabetic polyneuropathy: Secondary | ICD-10-CM | POA: Diagnosis not present

## 2022-12-03 DIAGNOSIS — E1122 Type 2 diabetes mellitus with diabetic chronic kidney disease: Secondary | ICD-10-CM | POA: Diagnosis not present

## 2022-12-10 DIAGNOSIS — Z961 Presence of intraocular lens: Secondary | ICD-10-CM | POA: Diagnosis not present

## 2022-12-10 DIAGNOSIS — H26493 Other secondary cataract, bilateral: Secondary | ICD-10-CM | POA: Diagnosis not present

## 2022-12-10 DIAGNOSIS — H18513 Endothelial corneal dystrophy, bilateral: Secondary | ICD-10-CM | POA: Diagnosis not present

## 2022-12-10 DIAGNOSIS — H26492 Other secondary cataract, left eye: Secondary | ICD-10-CM | POA: Diagnosis not present

## 2022-12-10 DIAGNOSIS — H43813 Vitreous degeneration, bilateral: Secondary | ICD-10-CM | POA: Diagnosis not present

## 2022-12-10 DIAGNOSIS — H11153 Pinguecula, bilateral: Secondary | ICD-10-CM | POA: Diagnosis not present

## 2022-12-27 DIAGNOSIS — Z20828 Contact with and (suspected) exposure to other viral communicable diseases: Secondary | ICD-10-CM | POA: Diagnosis not present

## 2022-12-27 DIAGNOSIS — R030 Elevated blood-pressure reading, without diagnosis of hypertension: Secondary | ICD-10-CM | POA: Diagnosis not present

## 2022-12-27 DIAGNOSIS — J329 Chronic sinusitis, unspecified: Secondary | ICD-10-CM | POA: Diagnosis not present

## 2022-12-27 DIAGNOSIS — J40 Bronchitis, not specified as acute or chronic: Secondary | ICD-10-CM | POA: Diagnosis not present

## 2022-12-27 DIAGNOSIS — Z6824 Body mass index (BMI) 24.0-24.9, adult: Secondary | ICD-10-CM | POA: Diagnosis not present

## 2023-01-20 ENCOUNTER — Ambulatory Visit (INDEPENDENT_AMBULATORY_CARE_PROVIDER_SITE_OTHER): Payer: Medicare Other | Admitting: Neurology

## 2023-01-20 ENCOUNTER — Encounter: Payer: Self-pay | Admitting: Neurology

## 2023-01-20 VITALS — BP 137/84 | HR 76 | Ht 73.6 in | Wt 184.0 lb

## 2023-01-20 DIAGNOSIS — G20C Parkinsonism, unspecified: Secondary | ICD-10-CM | POA: Diagnosis not present

## 2023-01-20 DIAGNOSIS — G2581 Restless legs syndrome: Secondary | ICD-10-CM | POA: Diagnosis not present

## 2023-01-20 MED ORDER — CARBIDOPA-LEVODOPA 25-100 MG PO TABS: 1.5000 | ORAL_TABLET | Freq: Three times a day (TID) | ORAL | 3 refills | Status: DC

## 2023-01-20 MED ORDER — RASAGILINE MESYLATE 1 MG PO TABS: ORAL_TABLET | ORAL | 5 refills | Status: DC

## 2023-01-20 NOTE — Progress Notes (Signed)
Patient: Harry Bowen Date of Birth: [birth year]  Reason for Visit: Follow up for PD History from: Patient Primary Neurologist: Dr. Terrace Arabia   ASSESSMENT AND PLAN 72 y.o. year old male   1.  Idiopathic Parkinson's disease -Mild to moderate worsening of symptoms, but still working full time job -Add on [employer] working up to 1 mg daily -Continue Sinemet 25/100 mg, 1.5 tablets 3 times daily -Encouraged increased exercise, activity -DaTscan in January 2020 confirmed decrease dopamine transporter activity in the posterior left strata -Could not tolerate Requip XR 2 mg -Follow-up in 5-6 months with Dr. Terrace Arabia  2.  Restless leg syndrome -EMG/NCV showed mild to moderate length dependent axonal sensorimotor polyneuropathy -On gabapentin 100 mg at bedtime  3.  Left cervical radiculopathy -MRI of cervical spine on June 26, 2020 from [facility] showed severe degenerative changes, evidence of severe canal stenosis at C3-4, C4-5, C5-6, with evidence of spinal cord compression, -Evidence of left proximal muscle weakness, significant chronic neuropathic changes involving left C5-C6-C7 myotomes, increased insertional activity at left cervical paraspinal muscles.  Brisk bilateral patellar reflex, bilateral Babinski signs -Has been referred to neurosurgery, he does not desire surgery at this point  HISTORY  Harry Bowen is a 72 year old male, seen in request by his primary care physician Dr. Fara Chute for evaluation of right hand tremor, initial evaluation was on October 15, 2018.   I have reviewed and summarized the referring note from the referring physician.  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   Since summer 2019, he noticed gradual onset right hand tremor, when holding the wheels, also noticed mild change in the way he walks, but denies significant difficulty,   he was admitted to the hospital from August to 5-8 2019 for generalized fatigue, hand tremor, history of tic  exposure, serology came back positive for [facility] spotted fever titer, he was treated with IV doxycycline for 3 days, followed by p.o. 100 mg twice a day for 10 days,   His fatigue has improved, but his right hand tremor remained,   He denies a family history of dementia, Alzheimer's disease    CT head without contrast August 2019 that was normal  MRI lumbar in August 2019, moderate to severe central canal stenosis, narrowing of both subarticular recess at L4-5 with potential impingement on L5, broad-based central and right-sided disc protrusion at L5-S1 impingement on right S1 nerve roots,   Laboratory evaluations in August 2019,  BMP showed mild elevated glucose 121, showed mildly decreased hemoglobin of 11.3, HIV,   DATscan on January 05, 2019, decreased dopamine transporter activity in the posterior left striata, in a pattern consistent with Parkinson syndrome pathology,   He continues to have mild right hand resting tremor, denies significant right-sided weakness, no gait abnormality, he has mild left arm weakness due to previous residual deficit from left cervical radiculopathy   UPDATE Nov 15 2019: He is here to follow-up for idiopathic Parkinson's disease, confirmed by abnormal DATscan in January 2020, he complains of increased right hand tremor, denies significant gait abnormalities, he takes care of his wife, who suffered stroke, also receiving dialysis,   UPDATE April 19 2020: Since last visit,  he has tried Requip XR 2 mg every night, complains of an tolerable side effect, "make me like a zombie", later will try to Sinemet 25/100 mg 3 times daily, he  tolerated the medication well, does help him move better, but only helped his right hand tremor mildly, he stopped taking the medication   He is a caregiver of his wife, who suffered stroke, total care, he denied depression, complains of difficulty sleeping, often experience right leg numbness, urge to move, also complains of bilateral feet paresthesia   UPDATE Jun 12 2020:  I reviewed the laboratory evaluation from his primary care Dr. Fara Chute on March 15, 2020, normal CMP, glucose was 141, creatinine is 1.05, A1c was 6.6, LDL was 57   Patient return for electrodiagnostic study today, which showed evidence of mild length dependent axonal sensorimotor polyneuropathy, most consistent with his diabetes, which is under good control, he has stopped his Metformin treatment,   Gabapentin 100 mg every night has helped his restless leg symptoms, he can sleep much better   He continues to be bothered by his on Parkinson features, almost constant right hand tremor, mild unsteady gait, he denies bowel and bladder incontinence   Today's electrodiagnostic study also demonstrate chronic left cervical radiculopathy, involving left C5, 6, 7 myotomes.  There was increased insertional activity at left cervical paraspinal muscles, he did report a history of cervical injury many years ago, noticed gradual onset slow worsening left arm weakness,   UPDATE Nov 07 2022: He is overall doing well, taking Sinemet 25/100 mg 1 tablet 3 times a day, noticed mild increased right hand stiffness at the end of the dose, also concerned about his right hand tremor, his main caregiver of his wife, who suffered a stroke,  He is also taking gabapentin 100 mg every night, which has helped him sleep better,   He denies significant memory loss,  Update July 15, 2022 SS: Increased Sinemet to 1.5 tablets 3 times daily in Jan 9 AM, 2 PM, 11 PM. Still tremor in hands, mostly in right, noticed chin quiver. Right hand has more stiffness.  Feels like speech is wet, can't get words out. He was doing silver sneakers, will get back once grand kids go back to school. He has a pool at his office. DM stable as far as he knows. Takes gabapentin 100 mg at bedtime, helps him relax at night for sleep. His wife passed in March 2023. No falls, or trouble chewing.    Update January 20, 2023 SS: he has gone back to work, he supervises the building of bridges, full time. He does a lot of sitting working on his reports in his truck. Is not as involved in silver sneakers. PD progressing, more tremor at rest, both hands, he can feel in face. Balance slightly off. Right hand is harder to write, handwriting is getting smaller. Remains on Sinemet 25/100 1.5 tablets 630 AM, 2 PM, 10 PM (only takes 1 tablet at bedtime). Gabapentin 100 mg at bedtime, helps with leg pain.    REVIEW OF SYSTEMS: Out of a complete 14 system review of symptoms, the patient complains only of the following symptoms, and all other reviewed systems are negative.  See HPI  ALLERGIES: Allergies  Allergen Reactions   Lipitor [Atorvastatin] Other (See Comments)    Muscle aches- tolerates Zocor    HOME MEDICATIONS: Outpatient Medications Prior to Visit  Medication Sig Dispense Refill   aspirin 81 MG tablet Take 81 mg by mouth as needed  for pain.     gabapentin (NEURONTIN) 100 MG capsule Take 1 capsule (100 mg total) by mouth 3 (three) times daily. 90 capsule 11   lisinopril (PRINIVIL,ZESTRIL) 10 MG tablet Take 10 mg by mouth every morning.   12   metoprolol succinate (TOPROL-XL) 25 MG 24 hr tablet Take 1 tablet (25 mg total) by mouth daily. Need visit for further refills 30 tablet 0   simvastatin (ZOCOR) 40 MG tablet Take 1 tablet (40 mg total) by mouth daily. 30 tablet 2   carbidopa-levodopa (SINEMET IR) 25-100 MG tablet Take 1.5 tablets by mouth 3 (three) times daily. 405 tablet 3   nitroGLYCERIN (NITROSTAT) 0.4 MG SL tablet Place 1 tablet (0.4 mg total) under the tongue every 5  (five) minutes as needed for chest pain. (Patient not taking: Reported on 01/20/2023) 25 tablet 3   No facility-administered medications prior to visit.    PAST MEDICAL HISTORY: Past Medical History:  Diagnosis Date   CAD (coronary artery disease)    a. 04/2015 Ant STEMI/PCI:  LM nl, LAD 100p w/ R->L Collats (3.5x23 Xience DES), LCX nl, RCA nl, EF 45%.   Diabetes mellitus without complication ([place])    Hypertension    Renal disorder    Tremor     PAST SURGICAL HISTORY: Past Surgical History:  Procedure Laterality Date   CARDIAC CATHETERIZATION N/A 05/08/2015   Procedure: Left Heart Cath and Coronary Angiography;  Surgeon: Troy Sine, MD;  Location: [place] [facility];  Service: Cardiovascular;  Laterality: N/A;   PERCUTANEOUS CORONARY STENT INTERVENTION (PCI-S)  05/08/2015   Procedure: Percutaneous Coronary Stent Intervention (Pci-S);  Surgeon: Troy Sine, MD;  Location: [place] [facility];  Service: Cardiovascular;;    FAMILY HISTORY: Family History  Problem Relation Age of Onset   Other Mother        healthy other than chronic pain   Other Father        MVA caused death   Heart attack Paternal Grandfather     SOCIAL HISTORY: Social History   Socioeconomic History   Marital status: Married    Spouse name: Not on file   Number of children: 3   Years of education: 12   Highest education level: High school graduate  Occupational History   Not on file  Tobacco Use   Smoking status: Never   Smokeless tobacco: Never  Vaping Use   Vaping Use: Never used  Substance and Sexual Activity   Alcohol use: No    Alcohol/week: 0.0 standard drinks of alcohol   Drug use: Never   Sexual activity: Not on file  Other Topics Concern   Not on file  Social History Narrative   Lives at home with his wife and oldest son   Right-handed.   Drinks 6-7 cups caffeine daily   Social Determinants of Health   Financial Resource Strain: Not on file  Food Insecurity: Not on file   Transportation Needs: Not on file  Physical Activity: Not on file  Stress: Not on file  Social Connections: Not on file  Intimate Partner Violence: Not on file   PHYSICAL EXAM  Vitals:   01/20/23 0943  BP: 137/84  Pulse: 76  Weight: 184 lb (83.5 kg)  Height: 6' 1.6" (1.869 m)    Body mass index is 23.88 kg/m.  Generalized: Well developed, in no acute distress  Neurological examination  Mentation: Alert oriented to time, place, history taking. Follows all commands speech and language fluent Cranial nerve  II-XII: Pupils were equal round reactive to light. Extraocular movements were full, visual field were full on confrontational test. Facial sensation and strength were normal.  Head turning and shoulder shrug  were normal and symmetric.  Mild masking of the face. Motor: Mild-moderate bradykinesia on the right, increased rigidity on the right, resting tremor right hand. Left arm 4/5 strength. Sensory: Sensory testing is intact to soft touch on all 4 extremities. No evidence of extinction is noted.  Coordination: Cerebellar testing reveals good finger-nose-finger and heel-to-shin bilaterally.  Gait and station: Able to stand from seated position with arms crossed, decreased arm swing on the right, resting tremor, cautious turns, good pace, steady Reflexes: Deep tendon reflexes are symmetric and normal bilaterally.   DIAGNOSTIC DATA (LABS, IMAGING, TESTING) - I reviewed patient records, labs, notes, testing and imaging myself where available.  Lab Results  Component Value Date   WBC 3.0 (L) 08/04/2018   HGB 11.3 (L) 08/04/2018   HCT 34.7 (L) 08/04/2018   MCV 91.1 08/04/2018   PLT 126 (L) 08/04/2018      Component Value Date/Time   NA 138 08/05/2018 0446   K 3.8 08/05/2018 0446   CL 106 08/05/2018 0446   CO2 26 08/05/2018 0446   GLUCOSE 121 (H) 08/05/2018 0446   BUN 13 08/05/2018 0446   CREATININE 0.94 08/05/2018 0446   CALCIUM 8.1 (L) 08/05/2018 0446   PROT 6.5  08/03/2018 0415   ALBUMIN 3.5 08/03/2018 0415   AST 28 08/03/2018 0415   ALT 21 08/03/2018 0415   ALKPHOS 58 08/03/2018 0415   BILITOT 1.3 (H) 08/03/2018 0415   GFRNONAA >60 08/05/2018 0446   GFRAA >60 08/05/2018 0446   Lab Results  Component Value Date   CHOL 144 05/09/2015   HDL 36 (L) 05/09/2015   LDLCALC 91 05/09/2015   TRIG 87 05/09/2015   CHOLHDL 4.0 05/09/2015   Lab Results  Component Value Date   HGBA1C 7.3 (H) 05/08/2015   No results found for: "VITAMINB12" No results found for: "TSH"  Butler Denmark, AGNP-C, DNP 01/20/2023, 10:24 AM [facility] [address], [place], [place] [address] [phone]

## 2023-01-20 NOTE — Progress Notes (Signed)
Chart reviewed, agree above plan ?

## 2023-01-20 NOTE — Patient Instructions (Addendum)
We will add on Azilect 1 mg tablet, start taking 1/2 tablet daily for 1 month then take 1 tablet daily Continue the Sinemet  Try to increase your exercise and activity  See you back in 5-6 months with Dr. Krista Blue

## 2023-01-21 DIAGNOSIS — Z961 Presence of intraocular lens: Secondary | ICD-10-CM | POA: Diagnosis not present

## 2023-01-21 DIAGNOSIS — H26491 Other secondary cataract, right eye: Secondary | ICD-10-CM | POA: Diagnosis not present

## 2023-02-02 ENCOUNTER — Other Ambulatory Visit: Payer: Self-pay | Admitting: Student

## 2023-04-13 DIAGNOSIS — G629 Polyneuropathy, unspecified: Secondary | ICD-10-CM | POA: Diagnosis not present

## 2023-04-13 DIAGNOSIS — E7849 Other hyperlipidemia: Secondary | ICD-10-CM | POA: Diagnosis not present

## 2023-04-13 DIAGNOSIS — R739 Hyperglycemia, unspecified: Secondary | ICD-10-CM | POA: Diagnosis not present

## 2023-04-13 DIAGNOSIS — I10 Essential (primary) hypertension: Secondary | ICD-10-CM | POA: Diagnosis not present

## 2023-04-13 DIAGNOSIS — Z6824 Body mass index (BMI) 24.0-24.9, adult: Secondary | ICD-10-CM | POA: Diagnosis not present

## 2023-04-13 DIAGNOSIS — N183 Chronic kidney disease, stage 3 unspecified: Secondary | ICD-10-CM | POA: Diagnosis not present

## 2023-04-13 DIAGNOSIS — E1142 Type 2 diabetes mellitus with diabetic polyneuropathy: Secondary | ICD-10-CM | POA: Diagnosis not present

## 2023-05-18 DIAGNOSIS — H43813 Vitreous degeneration, bilateral: Secondary | ICD-10-CM | POA: Diagnosis not present

## 2023-06-05 ENCOUNTER — Other Ambulatory Visit: Payer: Self-pay | Admitting: Student

## 2023-07-07 ENCOUNTER — Ambulatory Visit (INDEPENDENT_AMBULATORY_CARE_PROVIDER_SITE_OTHER): Payer: Medicare Other | Admitting: Neurology

## 2023-07-07 ENCOUNTER — Encounter: Payer: Self-pay | Admitting: Neurology

## 2023-07-07 VITALS — BP 120/72 | HR 71 | Ht 73.0 in | Wt 187.5 lb

## 2023-07-07 DIAGNOSIS — G20C Parkinsonism, unspecified: Secondary | ICD-10-CM

## 2023-07-07 DIAGNOSIS — G2581 Restless legs syndrome: Secondary | ICD-10-CM | POA: Diagnosis not present

## 2023-07-07 DIAGNOSIS — M5412 Radiculopathy, cervical region: Secondary | ICD-10-CM

## 2023-07-07 MED ORDER — CARBIDOPA-LEVODOPA 25-100 MG PO TABS: 2.0000 | ORAL_TABLET | Freq: Three times a day (TID) | ORAL | 3 refills | Status: DC

## 2023-07-07 MED ORDER — GABAPENTIN 100 MG PO CAPS: 100.0000 mg | ORAL_CAPSULE | Freq: Three times a day (TID) | ORAL | 11 refills | Status: DC

## 2023-07-07 NOTE — Progress Notes (Signed)
ASSESSMENT AND PLAN 72 y.o. year old male    Idiopathic Parkinson's disease  DaTscan in January 2020 confirmed decrease dopamine transporter activity in the posterior left strata  Could not tolerate Azilect due to fatigue, Could not tolerate Requip XR 2 mg  Will increase sinemet 25/100 2 tab at 630, 1130, 1700.   Restless leg syndrome -EMG/NCV showed mild to moderate length dependent axonal sensorimotor polyneuropathy -On gabapentin 100 mg at bedtime  3.  Left cervical radiculopathy -MRI of cervical spine on June 26, 2020 from [facility] showed severe degenerative changes, evidence of severe canal stenosis at C3-4, C4-5, C5-6, with evidence of spinal cord compression, -Evidence of left proximal muscle weakness, significant chronic neuropathic changes involving left C5-C6-C7 myotomes, increased insertional activity at left cervical paraspinal muscles.  Brisk bilateral patellar reflex, bilateral Babinski signs -Has been referred to neurosurgery, he does not desire surgery at this point  DIAGNOSTIC DATA (LABS, IMAGING, TESTING) - I reviewed patient records, labs, notes, testing and imaging myself where available.    HISTORY  Harry Bowen is a 72 year old male, seen in request by his primary care physician Dr. Fara Chute for evaluation of right hand tremor, initial evaluation was on October 15, 2018.   I have reviewed and summarized the referring note from the referring physician.  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   Since summer 2019, he noticed gradual onset right hand tremor, when holding the wheels, also noticed mild change in the  way he walks, but denies significant difficulty,   he was admitted to the hospital from August to 5-8 2019 for generalized fatigue, hand tremor, history of tic  exposure, serology came back positive for [facility] spotted fever titer, he was treated with IV doxycycline for 3 days, followed by p.o. 100 mg twice a day for 10 days,   His fatigue has improved, but his right hand tremor remained,   He denies a family history of dementia, Alzheimer's disease    CT head without contrast August 2019 that was normal  MRI lumbar in August 2019, moderate to severe central canal stenosis, narrowing of both subarticular recess at L4-5 with potential impingement on L5, broad-based central and right-sided disc protrusion at L5-S1 impingement on right S1 nerve roots,   Laboratory evaluations in August 2019,  BMP showed mild elevated glucose 121, showed mildly decreased hemoglobin of 11.3, HIV,   DATscan on January 05, 2019, decreased dopamine transporter activity in the posterior left striata, in a pattern consistent with Parkinson syndrome pathology,   He continues to have mild right hand resting tremor, denies significant right-sided weakness, no gait abnormality, he has mild left arm weakness due to previous residual deficit from left cervical radiculopathy   UPDATE Nov 15 2019: He is here to follow-up for idiopathic Parkinson's disease, confirmed by abnormal DATscan in January 2020, he complains of increased right hand tremor, denies significant gait abnormalities, he takes care of his wife, who suffered stroke, also receiving dialysis,   UPDATE April 19 2020: Since last visit, he has tried Requip XR 2 mg every night, complains of an tolerable side effect, "make me like a zombie", later will try to Sinemet  25/100 mg 3 times daily, he tolerated the medication well, does help him move better, but only helped his right hand tremor mildly, he stopped taking the medication   He is a caregiver of his wife,  who suffered stroke, total care, he denied depression, complains of difficulty sleeping, often experience right leg numbness, urge to move, also complains of bilateral feet paresthesia   UPDATE Jun 12 2020:  I reviewed the laboratory evaluation from his primary care Dr. Fara Chute on March 15, 2020, normal CMP, glucose was 141, creatinine is 1.05, A1c was 6.6, LDL was 57   Patient return for electrodiagnostic study today, which showed evidence of mild length dependent axonal sensorimotor polyneuropathy, most consistent with his diabetes, which is under good control, he has stopped his Metformin treatment,   Gabapentin 100 mg every night has helped his restless leg symptoms, he can sleep much better   He continues to be bothered by his on Parkinson features, almost constant right hand tremor, mild unsteady gait, he denies bowel and bladder incontinence   Today's electrodiagnostic study also demonstrate chronic left cervical radiculopathy, involving left C5, 6, 7 myotomes.  There was increased insertional activity at left cervical paraspinal muscles, he did report a history of cervical injury many years ago, noticed gradual onset slow worsening left arm weakness,   UPDATE Nov 07 2022: He is overall doing well, taking Sinemet 25/100 mg 1 tablet 3 times a day, noticed mild increased right hand stiffness at the end of the dose, also concerned about his right hand tremor, his main caregiver of his wife, who suffered a stroke,  He is also taking gabapentin 100 mg every night, which has helped him sleep better,   He denies significant memory loss,  UPDATE July 9th 2024: He lost his wife in March 2023, went back to work recently, become more sedentary, taking Sinemet 25/100 mg 1 and half tablets at 630, 12, 11 PM, noticed more balance issues, right hand rigidity, today's examination is taken 4 hours after morning dose, mild to moderate right side rigidity, bradykinesia, still fairly steady gait, will  increase Sinemet to 2 tablets at 630, 1130, 17, gabapentin 100 to 300 mg at night for restless leg symptoms.  He is no longer taking Azilect, complains of excessive morning fatigue  REVIEW OF SYSTEMS: Out of a complete 14 system review of symptoms, the patient complains only of the following symptoms, and all other reviewed systems are negative.  See HPI   PHYSICAL EXAM  Vitals:   07/07/23 0955  BP: 120/72  Pulse: 71  Weight: 187 lb 8 oz (85 kg)  Height: 6\' 1"  (1.854 m)    PHYSICAL EXAMNIATION:  Gen: NAD, conversant, well nourised, well groomed                     Cardiovascular: Regular rate rhythm, no peripheral edema, warm, nontender. Eyes: Conjunctivae clear without exudates or hemorrhage Neck: Supple, no carotid bruits. Pulmonary: Clear to auscultation bilaterally   NEUROLOGICAL EXAM:  MENTAL STATUS: Speech/cognition: Awake, alert oriented to history taking and casual conversation  CRANIAL NERVES: CN II: Visual fields are full to confrontation.  Pupils are round equal and briskly reactive to light. CN III, IV, VI: extraocular movement are normal. No ptosis. CN V: Facial sensation is intact to pinprick in all 3 divisions bilaterally. Corneal responses are intact.  CN VII: Face is symmetric with normal eye closure and smile. CN VIII: Hearing is normal to casual conversation CN IX,  X: Palate elevates symmetrically. Phonation is normal. CN XI: Head turning and shoulder shrug are intact CN XII: Tongue is midline with normal movements and no atrophy.  MOTOR: No significant resting tremor, right more than left mild to moderate rigidity, bradykinesia  REFLEXES: Reflexes are 1 and symmetric at the biceps, triceps, knees, and ankles. Plantar responses are flexor.  SENSORY: Intact to light touch, pinprick, positional and vibratory sensation are intact in fingers and toes.  COORDINATION: Rapid alternating movements and fine finger movements are intact. There is no dysmetria  on finger-to-nose and heel-knee-shin.    GAIT/STANCE: Able to get up from seated position arm crossed, moderate stride, mild decreased right arm swing, fairly smooth turning, mild leaning forward    ALLERGIES: Allergies  Allergen Reactions   Lipitor [Atorvastatin] Other (See Comments)    Muscle aches- tolerates Zocor   Rasagiline     Extreme fatigue    HOME MEDICATIONS: Outpatient Medications Prior to Visit  Medication Sig Dispense Refill   aspirin 81 MG tablet Take 81 mg by mouth as needed for pain.     carbidopa-levodopa (SINEMET IR) 25-100 MG tablet Take 1.5 tablets by mouth 3 (three) times daily. 405 tablet 3   gabapentin (NEURONTIN) 100 MG capsule Take 1 capsule (100 mg total) by mouth 3 (three) times daily. 90 capsule 11   lisinopril (PRINIVIL,ZESTRIL) 10 MG tablet Take 10 mg by mouth every morning.   12   metoprolol succinate (TOPROL-XL) 25 MG 24 hr tablet Take 1 tablet (25 mg total) by mouth daily. Need visit for further refills 30 tablet 0   nitroGLYCERIN (NITROSTAT) 0.4 MG SL tablet Place 1 tablet (0.4 mg total) under the tongue every 5 (five) minutes as needed for chest pain. (Patient not taking: Reported on 01/20/2023) 25 tablet 3   rasagiline (AZILECT) 1 MG TABS tablet Start taking 1/2 tablet daily for 1 month, then increase to 1 tablet daily 30 tablet 5   simvastatin (ZOCOR) 40 MG tablet TAKE 1 TABLET BY MOUTH DAILY (contact office FOR further refilles, overdue- last visit 2021) (Patient not taking: Reported on 07/07/2023) 30 tablet 0   No facility-administered medications prior to visit.    PAST MEDICAL HISTORY: Past Medical History:  Diagnosis Date   CAD (coronary artery disease)    a. 04/2015 Ant STEMI/PCI:  LM nl, LAD 100p w/ R->L Collats (3.5x23 Xience DES), LCX nl, RCA nl, EF 45%.   Diabetes mellitus without complication ([facility])    Hypertension    Renal disorder    Tremor     PAST SURGICAL HISTORY: Past Surgical History:  Procedure Laterality Date   CARDIAC  CATHETERIZATION N/A 05/08/2015   Procedure: Left Heart Cath and Coronary Angiography;  Surgeon: Lennette Bihari, MD;  Location: [facility];  Service: Cardiovascular;  Laterality: N/A;   PERCUTANEOUS CORONARY STENT INTERVENTION (PCI-S)  05/08/2015   Procedure: Percutaneous Coronary Stent Intervention (Pci-S);  Surgeon: Lennette Bihari, MD;  Location: [facility];  Service: Cardiovascular;;    FAMILY HISTORY: Family History  Problem Relation Age of Onset   Other Mother        healthy other than chronic pain   Other Father        MVA caused death   Heart attack Paternal Grandfather     SOCIAL HISTORY: Social History   Socioeconomic History   Marital status: Widowed    Spouse name: Not on file   Number of children: 3   Years of education: 35  Highest education level: High school graduate  Occupational History   Not on file  Tobacco Use   Smoking status: Never   Smokeless tobacco: Never  Vaping Use   Vaping Use: Never used  Substance and Sexual Activity   Alcohol use: No    Alcohol/week: 0.0 standard drinks of alcohol   Drug use: Never   Sexual activity: Not Currently  Other Topics Concern   Not on file  Social History Narrative   Lives at home with his wife and oldest son   Right-handed.   Drinks 6-7 cups caffeine daily   Social Determinants of Health   Financial Resource Strain: Not on file  Food Insecurity: Not on file  Transportation Needs: Not on file  Physical Activity: Not on file  Stress: Not on file  Social Connections: Not on file  Intimate Partner Violence: Not on file      Levert Feinstein, M.D. Ph.D.  [facility] [address] [place], [place] [address] Phone: [phone] Fax:      [phone]

## 2023-10-02 DIAGNOSIS — N183 Chronic kidney disease, stage 3 unspecified: Secondary | ICD-10-CM | POA: Diagnosis not present

## 2023-10-02 DIAGNOSIS — E1142 Type 2 diabetes mellitus with diabetic polyneuropathy: Secondary | ICD-10-CM | POA: Diagnosis not present

## 2023-10-02 DIAGNOSIS — E7849 Other hyperlipidemia: Secondary | ICD-10-CM | POA: Diagnosis not present

## 2023-10-05 DIAGNOSIS — N183 Chronic kidney disease, stage 3 unspecified: Secondary | ICD-10-CM | POA: Diagnosis not present

## 2023-10-05 DIAGNOSIS — G629 Polyneuropathy, unspecified: Secondary | ICD-10-CM | POA: Diagnosis not present

## 2023-10-05 DIAGNOSIS — Z6824 Body mass index (BMI) 24.0-24.9, adult: Secondary | ICD-10-CM | POA: Diagnosis not present

## 2023-10-05 DIAGNOSIS — E7849 Other hyperlipidemia: Secondary | ICD-10-CM | POA: Diagnosis not present

## 2023-10-05 DIAGNOSIS — R030 Elevated blood-pressure reading, without diagnosis of hypertension: Secondary | ICD-10-CM | POA: Diagnosis not present

## 2023-10-05 DIAGNOSIS — E1142 Type 2 diabetes mellitus with diabetic polyneuropathy: Secondary | ICD-10-CM | POA: Diagnosis not present

## 2023-10-05 DIAGNOSIS — I10 Essential (primary) hypertension: Secondary | ICD-10-CM | POA: Diagnosis not present

## 2023-10-05 DIAGNOSIS — E1122 Type 2 diabetes mellitus with diabetic chronic kidney disease: Secondary | ICD-10-CM | POA: Diagnosis not present

## 2024-01-19 NOTE — Progress Notes (Unsigned)
ASSESSMENT AND PLAN 73 y.o. year old male   1.  Idiopathic Parkinson's disease -DaTscan in January 2020 confirmed decrease dopamine transporter activity in the posterior left strata -Could not tolerate Azilect due to fatigue, -Could not tolerate Requip XR 2 mg -Continue Sinemet 25/100 mg 1.5 tablets 3 times daily, prefers to continue current dosing versus increasing to 2 tablets 3 times daily  2.  Restless leg syndrome -EMG/NCV showed mild to moderate length dependent axonal sensorimotor polyneuropathy -Under good control, continue gabapentin 100 mg up to 3 times daily  3.  Left cervical radiculopathy -MRI of cervical spine on June 26, 2020 from [facility] showed severe degenerative changes, evidence of severe canal stenosis at C3-4, C4-5, C5-6, with evidence of spinal cord compression, -Evidence of left proximal muscle weakness, significant chronic neuropathic changes involving left C5-C6-C7 myotomes, increased insertional activity at left cervical paraspinal muscles.  Brisk bilateral patellar reflex, bilateral Babinski signs -Has been referred to neurosurgery, he does not desire surgery at this point -No worsening of symptoms, he is working full time  Follow-up with me in 6 months or sooner if needed  DIAGNOSTIC DATA (LABS, IMAGING, TESTING) - I reviewed patient records, labs, notes, testing and imaging myself where available.    HISTORY  Harry Bowen is a 73 year old male, seen in request by his primary care physician Dr. Fara Chute for evaluation of right hand tremor, initial evaluation was on October 15, 2018.   I have reviewed and summarized the referring note from the referring physician.  He had past medical history of hypertension, hyperlipidemia, diabetes, coronary artery disease,   He noticed loss of sense of smell since 2017, he had mild constipation since 2018, denies orthostatic dizziness, denies significant REM sleep disorder, he continue to work as a Midwife, sometimes require ladder climbing, he used to be active, not exercise regularly anymore.   Since summer 2019, he noticed gradual onset right hand tremor, when holding the wheels, also noticed mild change in the way he walks, but denies significant difficulty,   he was admitted to the hospital from August to 5-8 2019 for generalized fatigue, hand tremor, history of tic  exposure, serology came back positive for [facility] spotted fever titer, he was treated with IV doxycycline for 3 days, followed by p.o. 100 mg twice a day for 10 days,   His fatigue has improved, but his right hand tremor remained,   He denies a family history of dementia, Alzheimer's disease    CT head without contrast August 2019 that was normal  MRI lumbar in August 2019, moderate to severe central canal stenosis, narrowing of both subarticular recess at L4-5 with potential impingement on L5, broad-based central and right-sided disc protrusion at L5-S1 impingement on right S1 nerve roots,   Laboratory evaluations in August 2019,  BMP showed mild elevated glucose 121, showed mildly decreased hemoglobin of 11.3, HIV,   DATscan on January 05, 2019, decreased dopamine transporter activity in the posterior left striata, in a pattern consistent with Parkinson syndrome pathology,   He continues to have mild right hand resting tremor, denies significant right-sided weakness, no gait abnormality, he has mild left arm weakness due to previous residual deficit from left cervical radiculopathy   UPDATE Nov 15 2019: He is here to follow-up for idiopathic Parkinson's disease, confirmed by abnormal DATscan in January 2020, he complains of increased right hand tremor, denies significant gait abnormalities, he takes care of his wife, who suffered stroke, also receiving  dialysis,   UPDATE April 19 2020: Since last visit, he has tried Requip XR 2 mg every night, complains of an tolerable side effect, "make me like a zombie",  later will try to Sinemet 25/100 mg 3 times daily, he tolerated the medication well, does help him move better, but only helped his right hand tremor mildly, he stopped taking the medication   He is a caregiver of his wife, who suffered stroke, total care, he denied depression, complains of difficulty sleeping, often experience right leg numbness, urge to move, also complains of bilateral feet paresthesia   UPDATE Jun 12 2020:  I reviewed the laboratory evaluation from his primary care Dr. Fara Chute on March 15, 2020, normal CMP, glucose was 141, creatinine is 1.05, A1c was 6.6, LDL was 57   Patient return for electrodiagnostic study today, which showed evidence of mild length dependent axonal sensorimotor polyneuropathy, most consistent with his diabetes, which is under good control, he has stopped his Metformin treatment,   Gabapentin 100 mg every night has helped his restless leg symptoms, he can sleep much better   He continues to be bothered by his on Parkinson features, almost constant right hand tremor, mild unsteady gait, he denies bowel and bladder incontinence   Today's electrodiagnostic study also demonstrate chronic left cervical radiculopathy, involving left C5, 6, 7 myotomes.  There was increased insertional activity at left cervical paraspinal muscles, he did report a history of cervical injury many years ago, noticed gradual onset slow worsening left arm weakness,   UPDATE Nov 07 2022: He is overall doing well, taking Sinemet 25/100 mg 1 tablet 3 times a day, noticed mild increased right hand stiffness at the end of the dose, also concerned about his right hand tremor, his main caregiver of his wife, who suffered a stroke,  He is also taking gabapentin 100 mg every night, which has helped him sleep better,   He denies significant memory loss,  UPDATE July 9th 2024: He lost his wife in March 2023, went back to work recently, become more sedentary, taking Sinemet 25/100 mg 1  and half tablets at 630, 12, 11 PM, noticed more balance issues, right hand rigidity, today's examination is taken 4 hours after morning dose, mild to moderate right side rigidity, bradykinesia, still fairly steady gait, will increase Sinemet to 2 tablets at 630, 1130, 17, gabapentin 100 to 300 mg at night for restless leg symptoms.  He is no longer taking Azilect, complains of excessive morning fatigue  Update January 20, 2024 SS: Last visit Dr.Yan increased Sinemet 25/100 to 2 tablets TID, he continued to take 1.5 tablets TID. Balance is a little worse, sometime speech is slurred. Getting around well, no falls. Tremor in both hands, mostly right. Takes gabapentin for RLS at bedtime. Last took his Sinemet 7:30 AM, sometimes forgets midday dosing, doesn't notice any freezing. He is working part time as Dietitian working on a bridge. Denies neck pain.   REVIEW OF SYSTEMS: Out of a complete 14 system review of symptoms, the patient complains only of the following symptoms, and all other reviewed systems are negative.  See HPI   PHYSICAL EXAM  Vitals:   07/07/23 0955  BP: 120/72  Pulse: 71  Weight: 187 lb 8 oz (85 kg)  Height: 6\' 1"  (1.854 m)    PHYSICAL EXAMNIATION:  Gen: NAD, conversant, well nourised, well groomed  Cardiovascular: Regular rate rhythm, no peripheral edema, warm, nontender.  NEUROLOGICAL EXAM:  MENTAL STATUS: Speech/cognition: Awake, alert oriented to history taking and casual conversation  CRANIAL NERVES: CN II: Visual fields are full to confrontation.  Pupils are round equal and briskly reactive to light. CN III, IV, VI: extraocular movement are normal. No ptosis. CN V: Facial sensation is intact to pinprick in all 3 divisions bilaterally. Corneal responses are intact.  CN VII: Face is symmetric with normal eye closure and smile. Mild masking of the face.  CN VIII: Hearing is normal to casual conversation CN XI: Head turning and  shoulder shrug are intact  MOTOR: Right arm resting tremor, right more than left mild to moderate rigidity, bradykinesia  REFLEXES: Reflexes are normal and symmetric at the biceps, triceps, knees  SENSORY: Intact to light touch COORDINATION: Rapid alternating movements and fine finger movements are intact. There is no dysmetria on finger-to-nose and heel-knee-shin.    GAIT/STANCE: Normal base, decreased arm swing on the right, tremor noted to the right hand, overall steady   ALLERGIES: Allergies  Allergen Reactions   Lipitor [Atorvastatin] Other (See Comments)    Muscle aches- tolerates Zocor   Rasagiline     Extreme fatigue    HOME MEDICATIONS: Outpatient Medications Prior to Visit  Medication Sig Dispense Refill   aspirin 81 MG tablet Take 81 mg by mouth as needed for pain.     carbidopa-levodopa (SINEMET IR) 25-100 MG tablet Take 2 tablets by mouth 3 (three) times daily. 630am, 1130am, 1700pm 540 tablet 3   gabapentin (NEURONTIN) 100 MG capsule Take 1 capsule (100 mg total) by mouth 3 (three) times daily. 90 capsule 11   lisinopril (PRINIVIL,ZESTRIL) 10 MG tablet Take 10 mg by mouth every morning.   12   metoprolol succinate (TOPROL-XL) 25 MG 24 hr tablet Take 1 tablet (25 mg total) by mouth daily. Need visit for further refills 30 tablet 0   simvastatin (ZOCOR) 40 MG tablet Take 40 mg by mouth at bedtime.     No facility-administered medications prior to visit.    PAST MEDICAL HISTORY: Past Medical History:  Diagnosis Date   CAD (coronary artery disease)    a. 04/2015 Ant STEMI/PCI:  LM nl, LAD 100p w/ R->L Collats (3.5x23 Xience DES), LCX nl, RCA nl, EF 45%.   Diabetes mellitus without complication (HCC)    Hypertension    Renal disorder    Tremor     PAST SURGICAL HISTORY: Past Surgical History:  Procedure Laterality Date   CARDIAC CATHETERIZATION N/A 05/08/2015   Procedure: Left Heart Cath and Coronary Angiography;  Surgeon: Lennette Bihari, MD;  Location: [facility]  INVASIVE CV LAB;  Service: Cardiovascular;  Laterality: N/A;   PERCUTANEOUS CORONARY STENT INTERVENTION (PCI-S)  05/08/2015   Procedure: Percutaneous Coronary Stent Intervention (Pci-S);  Surgeon: Lennette Bihari, MD;  Location: [facility] INVASIVE CV LAB;  Service: Cardiovascular;;    FAMILY HISTORY: Family History  Problem Relation Age of Onset   Other Mother        healthy other than chronic pain   Other Father        MVA caused death   Heart attack Paternal Grandfather     SOCIAL HISTORY: Social History   Socioeconomic History   Marital status: Widowed    Spouse name: Not on file   Number of children: 3   Years of education: 12   Highest education level: High school graduate  Occupational History   Not on file  Tobacco Use   Smoking status: Never   Smokeless tobacco: Never  Vaping Use   Vaping status: Never Used  Substance and Sexual Activity   Alcohol use: No    Alcohol/week: 0.0 standard drinks of alcohol   Drug use: Never   Sexual activity: Not Currently  Other Topics Concern   Not on file  Social History Narrative   Lives at home with his wife and oldest son   Right-handed.   Drinks 6-7 cups caffeine daily   Social Drivers of Corporate investment banker Strain: Not on file  Food Insecurity: Not on file  Transportation Needs: Not on file  Physical Activity: Not on file  Stress: Not on file  Social Connections: Unknown (05/13/2022)   [facility]   Social Network    Social Network: Not on file  Intimate Partner Violence: Unknown (04/04/2022)   Received from [facility], [facility]   HITS    Physically Hurt: Not on file    Insult or Talk Down To: Not on file    Threaten Physical Harm: Not on file    Scream or Curse: Not on file   Otila Kluver, DNP  [facility] [address] [place], [place] [address] [phone]

## 2024-01-20 ENCOUNTER — Ambulatory Visit (INDEPENDENT_AMBULATORY_CARE_PROVIDER_SITE_OTHER): Payer: Medicare Other | Admitting: Neurology

## 2024-01-20 ENCOUNTER — Encounter: Payer: Self-pay | Admitting: Neurology

## 2024-01-20 VITALS — BP 142/78 | HR 69 | Ht 73.6 in | Wt 182.0 lb

## 2024-01-20 DIAGNOSIS — M5412 Radiculopathy, cervical region: Secondary | ICD-10-CM | POA: Diagnosis not present

## 2024-01-20 DIAGNOSIS — G20C Parkinsonism, unspecified: Secondary | ICD-10-CM

## 2024-01-20 DIAGNOSIS — G2581 Restless legs syndrome: Secondary | ICD-10-CM | POA: Diagnosis not present

## 2024-01-20 NOTE — Patient Instructions (Signed)
Continue Sinemet 25/100 1.5 tablets 3 times daily Continue gabapentin 100 mg up to 3 times daily Continue exercise, activity  Follow up in 6 months

## 2024-02-25 DIAGNOSIS — E1165 Type 2 diabetes mellitus with hyperglycemia: Secondary | ICD-10-CM | POA: Diagnosis not present

## 2024-02-25 DIAGNOSIS — E1122 Type 2 diabetes mellitus with diabetic chronic kidney disease: Secondary | ICD-10-CM | POA: Diagnosis not present

## 2024-02-25 DIAGNOSIS — E7800 Pure hypercholesterolemia, unspecified: Secondary | ICD-10-CM | POA: Diagnosis not present

## 2024-02-25 DIAGNOSIS — E7801 Familial hypercholesterolemia: Secondary | ICD-10-CM | POA: Diagnosis not present

## 2024-02-25 DIAGNOSIS — I10 Essential (primary) hypertension: Secondary | ICD-10-CM | POA: Diagnosis not present

## 2024-03-03 DIAGNOSIS — Z6823 Body mass index (BMI) 23.0-23.9, adult: Secondary | ICD-10-CM | POA: Diagnosis not present

## 2024-03-03 DIAGNOSIS — Z23 Encounter for immunization: Secondary | ICD-10-CM | POA: Diagnosis not present

## 2024-03-03 DIAGNOSIS — E1165 Type 2 diabetes mellitus with hyperglycemia: Secondary | ICD-10-CM | POA: Diagnosis not present

## 2024-03-03 DIAGNOSIS — E1122 Type 2 diabetes mellitus with diabetic chronic kidney disease: Secondary | ICD-10-CM | POA: Diagnosis not present

## 2024-03-03 DIAGNOSIS — I10 Essential (primary) hypertension: Secondary | ICD-10-CM | POA: Diagnosis not present

## 2024-03-03 DIAGNOSIS — E7800 Pure hypercholesterolemia, unspecified: Secondary | ICD-10-CM | POA: Diagnosis not present

## 2024-06-27 DIAGNOSIS — E1165 Type 2 diabetes mellitus with hyperglycemia: Secondary | ICD-10-CM | POA: Diagnosis not present

## 2024-06-27 DIAGNOSIS — I10 Essential (primary) hypertension: Secondary | ICD-10-CM | POA: Diagnosis not present

## 2024-06-27 DIAGNOSIS — E7801 Familial hypercholesterolemia: Secondary | ICD-10-CM | POA: Diagnosis not present

## 2024-06-27 DIAGNOSIS — E1122 Type 2 diabetes mellitus with diabetic chronic kidney disease: Secondary | ICD-10-CM | POA: Diagnosis not present

## 2024-06-27 DIAGNOSIS — E7849 Other hyperlipidemia: Secondary | ICD-10-CM | POA: Diagnosis not present

## 2024-07-04 DIAGNOSIS — E1142 Type 2 diabetes mellitus with diabetic polyneuropathy: Secondary | ICD-10-CM | POA: Diagnosis not present

## 2024-07-04 DIAGNOSIS — E782 Mixed hyperlipidemia: Secondary | ICD-10-CM | POA: Diagnosis not present

## 2024-07-04 DIAGNOSIS — G629 Polyneuropathy, unspecified: Secondary | ICD-10-CM | POA: Diagnosis not present

## 2024-07-04 DIAGNOSIS — Z23 Encounter for immunization: Secondary | ICD-10-CM | POA: Diagnosis not present

## 2024-07-04 DIAGNOSIS — Z6823 Body mass index (BMI) 23.0-23.9, adult: Secondary | ICD-10-CM | POA: Diagnosis not present

## 2024-07-04 DIAGNOSIS — I10 Essential (primary) hypertension: Secondary | ICD-10-CM | POA: Diagnosis not present

## 2024-07-04 DIAGNOSIS — E7849 Other hyperlipidemia: Secondary | ICD-10-CM | POA: Diagnosis not present

## 2024-08-01 ENCOUNTER — Encounter: Payer: Self-pay | Admitting: Neurology

## 2024-08-01 ENCOUNTER — Ambulatory Visit (INDEPENDENT_AMBULATORY_CARE_PROVIDER_SITE_OTHER): Admitting: Neurology

## 2024-08-01 VITALS — BP 115/74 | HR 71 | Resp 14 | Ht 73.0 in

## 2024-08-01 DIAGNOSIS — M5412 Radiculopathy, cervical region: Secondary | ICD-10-CM | POA: Diagnosis not present

## 2024-08-01 DIAGNOSIS — G20C Parkinsonism, unspecified: Secondary | ICD-10-CM

## 2024-08-01 MED ORDER — GABAPENTIN 100 MG PO CAPS: 100.0000 mg | ORAL_CAPSULE | Freq: Three times a day (TID) | ORAL | 11 refills | Status: AC

## 2024-08-01 MED ORDER — CARBIDOPA-LEVODOPA 25-100 MG PO TABS: 2.0000 | ORAL_TABLET | Freq: Three times a day (TID) | ORAL | 3 refills | Status: AC

## 2024-08-01 NOTE — Patient Instructions (Signed)
 Follow-up with neurosurgery about your neck  Continue current dose of Sinemet  Recommend exercise, activity Follow-up in 6 months

## 2024-08-01 NOTE — Progress Notes (Signed)
 ASSESSMENT AND PLAN 73 y.o. year old male   1.  Idiopathic Parkinson's disease -DaTscan  in January 2020 confirmed decrease dopamine transporter activity in the posterior left strata -Could not tolerate Azilect  due to fatigue -Could not tolerate Requip  XR 2 mg -Continue Sinemet  25/100 mg 2 tablets 3 times daily  -Exercise, activity  -Try hard candy for drooling   2.  Restless leg syndrome -EMG/NCV showed mild to moderate length dependent axonal sensorimotor polyneuropathy -Under good control, continue gabapentin  100 mg up to 3 times daily  3.  Left cervical radiculopathy -MRI of cervical spine on June 26, 2020 from [facility] showed severe degenerative changes, evidence of severe canal stenosis at C3-4, C4-5, C5-6, with evidence of spinal cord compression, -Evidence of left proximal muscle weakness, mostly involving the left arm, advised to follow up with neurosurgery for re-evaluation, however he does not want surgery at this time   Follow-up with Dr.Yan in 6 months to rotate every few visits with me  DIAGNOSTIC DATA (LABS, IMAGING, TESTING) - I reviewed patient records, labs, notes, testing and imaging myself where available.    HISTORY  Harry Bowen is a 73 year old male, seen in request by his primary care physician Dr. Deward Soja for evaluation of right hand tremor, initial evaluation was on October 15, 2018.   I have reviewed and summarized the referring note from the referring physician.  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   Since summer 2019, he noticed gradual onset right hand tremor, when holding the wheels, also noticed  mild change in the way he walks, but denies significant difficulty,   he was admitted to the hospital from August to 5-8 2019 for generalized fatigue, hand tremor, history of tic  exposure, serology came back positive for [facility] spotted fever titer, he was treated with IV doxycycline  for 3 days, followed by p.o. 100 mg twice a day for 10 days,   His fatigue has improved, but his right hand tremor remained,   He denies a family history of dementia, Alzheimer's disease    CT head without contrast August 2019 that was normal  MRI lumbar in August 2019, moderate to severe central canal stenosis, narrowing of both subarticular recess at L4-5 with potential impingement on L5, broad-based central and right-sided disc protrusion at L5-S1 impingement on right S1 nerve roots,   Laboratory evaluations in August 2019,  BMP showed mild elevated glucose 121, showed mildly decreased hemoglobin of 11.3, HIV,   DATscan  on January 05, 2019, decreased dopamine transporter activity in the posterior left striata, in a pattern consistent with Parkinson syndrome pathology,   He continues to have mild right hand resting tremor, denies significant right-sided weakness, no gait abnormality, he has mild left arm weakness due to previous residual deficit from left cervical radiculopathy   UPDATE Nov 15 2019: He is here to follow-up for idiopathic Parkinson's disease, confirmed by abnormal DATscan  in January 2020, he complains of increased right hand tremor, denies significant gait abnormalities, he takes care of his wife, who suffered stroke, also receiving dialysis,   UPDATE April 19 2020: Since last visit, he has tried Requip  XR 2 mg every night, complains of an tolerable side  effect, make me like a zombie, later will try to Sinemet  25/100 mg 3 times daily, he tolerated the medication well, does help him move better, but only helped his right hand tremor mildly, he stopped taking the medication   He is a  caregiver of his wife, who suffered stroke, total care, he denied depression, complains of difficulty sleeping, often experience right leg numbness, urge to move, also complains of bilateral feet paresthesia   UPDATE Jun 12 2020:  I reviewed the laboratory evaluation from his primary care Dr. Deward Soja on March 15, 2020, normal CMP, glucose was 141, creatinine is 1.05, A1c was 6.6, LDL was 57   Patient return for electrodiagnostic study today, which showed evidence of mild length dependent axonal sensorimotor polyneuropathy, most consistent with his diabetes, which is under good control, he has stopped his Metformin  treatment,   Gabapentin  100 mg every night has helped his restless leg symptoms, he can sleep much better   He continues to be bothered by his on Parkinson features, almost constant right hand tremor, mild unsteady gait, he denies bowel and bladder incontinence   Today's electrodiagnostic study also demonstrate chronic left cervical radiculopathy, involving left C5, 6, 7 myotomes.  There was increased insertional activity at left cervical paraspinal muscles, he did report a history of cervical injury many years ago, noticed gradual onset slow worsening left arm weakness,   UPDATE Nov 07 2022: He is overall doing well, taking Sinemet  25/100 mg 1 tablet 3 times a day, noticed mild increased right hand stiffness at the end of the dose, also concerned about his right hand tremor, his main caregiver of his wife, who suffered a stroke,  He is also taking gabapentin  100 mg every night, which has helped him sleep better,   He denies significant memory loss,  UPDATE July 9th 2024: He lost his wife in March 2023, went back to work recently, become more sedentary, taking Sinemet  25/100 mg 1 and half tablets at 630, 12, 11 PM, noticed more balance issues, right hand rigidity, today's examination is taken 4 hours after morning dose, mild to moderate right side rigidity, bradykinesia, still  fairly steady gait, will increase Sinemet  to 2 tablets at 630, 1130, 17, gabapentin  100 to 300 mg at night for restless leg symptoms.  He is no longer taking Azilect , complains of excessive morning fatigue  Update January 20, 2024 SS: Last visit Dr.Yan increased Sinemet  25/100 to 2 tablets TID, he continued to take 1.5 tablets TID. Balance is a little worse, sometime speech is slurred. Getting around well, no falls. Tremor in both hands, mostly right. Takes gabapentin  for RLS at bedtime. Last took his Sinemet  7:30 AM, sometimes forgets midday dosing, doesn't notice any freezing. He is working part time as Dietitian working on a bridge. Denies neck pain.  Update August 01, 2024 SS: Finishing up project for work, hopes to go back into retirement.  Feels PD affecting speech, slurry, drools at night. He increased Sinemet  25/100 2 tablets TID, tremor in hands. Balance bothers him most, 1st few steps are unsteady. Is afraid to have surgery on his neck, his left arm is weak. His son lives with him. He is independent, he drives. Has not seen neurosurgery, indicates has been dealing with his neck issues for 30 years. No neck pain.   REVIEW OF SYSTEMS: Out of a complete 14 system review of symptoms, the patient complains only of the following symptoms, and all other reviewed systems are negative.  See HPI  PHYSICAL EXAM  Vitals:   07/07/23 0955  BP: 120/72  Pulse: 71  Weight: 187 lb 8 oz (85 kg)  Height: 6' 1 (1.854 m)   PHYSICAL EXAMNIATION:  Gen: NAD, conversant, well nourised, well groomed                     Cardiovascular: Regular rate rhythm, no peripheral edema, warm, nontender.  NEUROLOGICAL EXAM:  MENTAL STATUS: Speech/cognition: Awake, alert oriented to history taking and casual conversation  CRANIAL NERVES: CN II: Visual fields are full to confrontation.  Pupils are round equal and briskly reactive to light. CN III, IV, VI: extraocular movement are normal. No ptosis. CN V:  Facial sensation is intact to pinprick in all 3 divisions bilaterally. Corneal responses are intact.  CN VII: Face is symmetric with normal eye closure and smile. Mild masking of the face.  CN VIII: Hearing is normal to casual conversation CN XI: Head turning and shoulder shrug are intact  MOTOR: Right arm resting tremor, right more than left mild to moderate rigidity, bradykinesia  REFLEXES: Reflexes are normal and symmetric at the biceps, triceps, knees  SENSORY: Intact to light touch COORDINATION: Rapid alternating movements and fine finger movements are intact. There is no dysmetria on finger-to-nose and heel-knee-shin.    GAIT/STANCE: Can stand from seated position with arms crossed.  Normal base, decreased arm swing on the right, tremor noted to the right hand, overall steady   ALLERGIES: Allergies  Allergen Reactions   Lipitor  [Atorvastatin ] Other (See Comments)    Muscle aches- tolerates Zocor    Rasagiline      Extreme fatigue    HOME MEDICATIONS: Outpatient Medications Prior to Visit  Medication Sig Dispense Refill   aspirin  81 MG tablet Take 81 mg by mouth as needed for pain.     carbidopa -levodopa  (SINEMET  IR) 25-100 MG tablet Take 2 tablets by mouth 3 (three) times daily. 630am, 1130am, 1700pm 540 tablet 3   gabapentin  (NEURONTIN ) 100 MG capsule Take 1 capsule (100 mg total) by mouth 3 (three) times daily. 90 capsule 11   lisinopril  (PRINIVIL ,ZESTRIL ) 10 MG tablet Take 10 mg by mouth every morning.   12   metoprolol  succinate (TOPROL -XL) 25 MG 24 hr tablet Take 1 tablet (25 mg total) by mouth daily. Need visit for further refills 30 tablet 0   simvastatin  (ZOCOR ) 40 MG tablet Take 40 mg by mouth at bedtime.     No facility-administered medications prior to visit.    PAST MEDICAL HISTORY: Past Medical History:  Diagnosis Date   CAD (coronary artery disease)    a. 04/2015 Ant STEMI/PCI:  LM nl, LAD 100p w/ R->L Collats (3.5x23 Xience DES), LCX nl, RCA nl, EF 45%.    Diabetes mellitus without complication (HCC)    Hypertension    Renal disorder    Tremor     PAST SURGICAL HISTORY: Past Surgical History:  Procedure Laterality Date   CARDIAC CATHETERIZATION N/A 05/08/2015   Procedure: Left Heart Cath and Coronary Angiography;  Surgeon: Debby DELENA Sor, MD;  Location: [facility] INVASIVE CV LAB;  Service: Cardiovascular;  Laterality: N/A;   PERCUTANEOUS CORONARY STENT INTERVENTION (PCI-S)  05/08/2015   Procedure: Percutaneous Coronary Stent Intervention (Pci-S);  Surgeon: Debby DELENA Sor, MD;  Location[facility] INVASIVE CV LAB;  Service: Cardiovascular;;    FAMILY HISTORY: Family History  Problem Relation Age of Onset   Other Mother        healthy other than chronic pain   Other Father  MVA caused death   Heart attack Paternal Grandfather     SOCIAL HISTORY: Social History   Socioeconomic History   Marital status: Widowed    Spouse name: Not on file   Number of children: 3   Years of education: 12   Highest education level: High school graduate  Occupational History   Not on file  Tobacco Use   Smoking status: Never   Smokeless tobacco: Never  Vaping Use   Vaping status: Never Used  Substance and Sexual Activity   Alcohol  use: No    Alcohol /week: 0.0 standard drinks of alcohol    Drug use: Never   Sexual activity: Not Currently  Other Topics Concern   Not on file  Social History Narrative   Lives at home with his wife and oldest son   Right-handed.   Drinks 6-7 cups caffeine daily   Social Drivers of Corporate investment banker Strain: Not on file  Food Insecurity: Not on file  Transportation Needs: Not on file  Physical Activity: Not on file  Stress: Not on file  Social Connections: Unknown (05/13/2022)   Received from [facility]   Social Network    Social Network: Not on file  Intimate Partner Violence: Unknown (04/04/2022)   Received from [facility]   HITS    Physically Hurt: Not on file    Insult or Talk Down To: Not on  file    Threaten Physical Harm: Not on file    Scream or Curse: Not on file   Lauraine Gayland MANDES, DNP  [facility] [address], [address] [place], [place] [address] [phone]

## 2024-08-09 ENCOUNTER — Ambulatory Visit: Payer: Medicare Other | Admitting: Neurology

## 2024-11-03 DIAGNOSIS — I10 Essential (primary) hypertension: Secondary | ICD-10-CM | POA: Diagnosis not present

## 2024-11-03 DIAGNOSIS — E782 Mixed hyperlipidemia: Secondary | ICD-10-CM | POA: Diagnosis not present

## 2024-11-03 DIAGNOSIS — I2510 Atherosclerotic heart disease of native coronary artery without angina pectoris: Secondary | ICD-10-CM | POA: Diagnosis not present

## 2024-11-03 DIAGNOSIS — E1142 Type 2 diabetes mellitus with diabetic polyneuropathy: Secondary | ICD-10-CM | POA: Diagnosis not present

## 2024-11-03 DIAGNOSIS — G629 Polyneuropathy, unspecified: Secondary | ICD-10-CM | POA: Diagnosis not present

## 2024-11-03 DIAGNOSIS — E7849 Other hyperlipidemia: Secondary | ICD-10-CM | POA: Diagnosis not present

## 2024-11-03 DIAGNOSIS — G20B1 Parkinson's disease with dyskinesia, without mention of fluctuations: Secondary | ICD-10-CM | POA: Diagnosis not present

## 2024-11-03 DIAGNOSIS — Z6823 Body mass index (BMI) 23.0-23.9, adult: Secondary | ICD-10-CM | POA: Diagnosis not present

## 2025-02-06 ENCOUNTER — Ambulatory Visit: Admitting: Neurology
